# Patient Record
Sex: Male | Born: 1955 | Race: White | Hispanic: No | Marital: Married | Smoking: Never smoker
Health system: Southern US, Community
[De-identification: ages and names within clinical notes are randomized; demographics above are authoritative.]

## PROBLEM LIST (undated history)

## (undated) DIAGNOSIS — M1990 Unspecified osteoarthritis, unspecified site: Secondary | ICD-10-CM

## (undated) DIAGNOSIS — Z8719 Personal history of other diseases of the digestive system: Secondary | ICD-10-CM

## (undated) DIAGNOSIS — J45909 Unspecified asthma, uncomplicated: Secondary | ICD-10-CM

## (undated) DIAGNOSIS — J302 Other seasonal allergic rhinitis: Secondary | ICD-10-CM

## (undated) DIAGNOSIS — K219 Gastro-esophageal reflux disease without esophagitis: Secondary | ICD-10-CM

## (undated) DIAGNOSIS — IMO0002 Reserved for concepts with insufficient information to code with codable children: Secondary | ICD-10-CM

## (undated) HISTORY — PX: KNEE ARTHROSCOPY: SHX127

## (undated) HISTORY — PX: KNEE ARTHROSCOPY: SUR90

## (undated) HISTORY — PX: ESOPHAGEAL DILATION: SHX303

## (undated) HISTORY — PX: CERVICAL SPINE SURGERY: SHX589

---

## 2013-09-27 ENCOUNTER — Other Ambulatory Visit: Payer: Self-pay | Admitting: Neurosurgery

## 2013-10-11 ENCOUNTER — Encounter (HOSPITAL_COMMUNITY): Payer: Self-pay | Admitting: Pharmacy Technician

## 2013-10-13 ENCOUNTER — Encounter (HOSPITAL_COMMUNITY)
Admission: RE | Admit: 2013-10-13 | Discharge: 2013-10-13 | Disposition: A | Discharge status: Home / Self Care | Payer: PRIVATE HEALTH INSURANCE | Source: Ambulatory Visit | Attending: Neurosurgery | Admitting: Neurosurgery

## 2013-10-13 ENCOUNTER — Encounter (HOSPITAL_COMMUNITY): Payer: Self-pay

## 2013-10-13 DIAGNOSIS — Z01812 Encounter for preprocedural laboratory examination: Secondary | ICD-10-CM | POA: Insufficient documentation

## 2013-10-13 HISTORY — DX: Unspecified asthma, uncomplicated: J45.909

## 2013-10-13 HISTORY — DX: Reserved for concepts with insufficient information to code with codable children: IMO0002

## 2013-10-13 HISTORY — DX: Other seasonal allergic rhinitis: J30.2

## 2013-10-13 HISTORY — DX: Gastro-esophageal reflux disease without esophagitis: K21.9

## 2013-10-13 HISTORY — DX: Unspecified osteoarthritis, unspecified site: M19.90

## 2013-10-13 HISTORY — DX: Personal history of other diseases of the digestive system: Z87.19

## 2013-10-13 LAB — CBC
HEMATOCRIT: 46 % (ref 39.0–52.0)
Hemoglobin: 16.3 g/dL (ref 13.0–17.0)
MCH: 30.8 pg (ref 26.0–34.0)
MCHC: 35.4 g/dL (ref 30.0–36.0)
MCV: 87 fL (ref 78.0–100.0)
Platelets: 242 10*3/uL (ref 150–400)
RBC: 5.29 MIL/uL (ref 4.22–5.81)
RDW: 13.3 % (ref 11.5–15.5)
WBC: 7.5 10*3/uL (ref 4.0–10.5)

## 2013-10-13 LAB — BASIC METABOLIC PANEL
BUN: 14 mg/dL (ref 6–23)
CHLORIDE: 102 meq/L (ref 96–112)
CO2: 24 mEq/L (ref 19–32)
Calcium: 9.7 mg/dL (ref 8.4–10.5)
Creatinine, Ser: 0.94 mg/dL (ref 0.50–1.35)
GFR calc Af Amer: >90 mL/min (ref >90)
GLUCOSE: 143 mg/dL — AB (ref 70–99)
POTASSIUM: 4.3 meq/L (ref 3.7–5.3)
Sodium: 141 mEq/L (ref 137–147)

## 2013-10-13 NOTE — Pre-Procedure Instructions (Signed)
Noah PartySteve D Colon  10/13/2013   Your procedure is scheduled on:  Friday, April 10.  [facility], Main Entrance [name]/Entrance "A" at 5:30 AM.  Call this number if you have problems the morning of surgery: [phone]   Remember:   Do not eat food or drink liquids after midnight Thursday, April 9.   Take these medicines the morning of surgery with A SIP OF WATER: baclofen (LIORESAL), cetirizine (ZYRTEC), DULoxetine (CYMBALTA), gabapentin (NEURONTIN).     Do not wear jewelry, make-up or nail polish.  Do not wear lotions, powders, or perfumes.  Men may shave face and neck.  Do not bring valuables to the hospital.  [facility] is not responsible for any belongings or valuables.               Contacts, dentures or bridgework may not be worn into surgery.  Leave suitcase in the car. After surgery it may be brought to your room.  For patients admitted to the hospital, discharge time is determined by your  treatment team.               Patients discharged the day of surgery will not be allowed to drive home.  Name and phone number of your driver: -  Special Instructions: Review  [place] - Preparing For Surgery.   Please read over the following fact sheets that you were given: Pain Booklet, Coughing and Deep Breathing and Surgical Site Infection Prevention

## 2013-10-13 NOTE — Pre-Procedure Instructions (Signed)
Valinda PartySteve D Sibert  10/13/2013   Your procedure is scheduled on:  Friday, April 10.  [facility], Main Entrance [name]/Entrance "A" at 5:30 AM.  Call this number if you have problems the morning of surgery: [phone]   Remember:   Do not eat food or drink liquids after midnight Thursday, April 9.   Take these medicines the morning of surgery with A SIP OF WATER:  baclofen (LIORESAL), cetirizine (ZYRTEC), DULoxetine (CYMBALTA), gabapentin (NEURONTIN).              Stop taking diclofenac (VOLTAREN).   Do not wear jewelry, make-up or nail polish.  Do not wear lotions, powders, or perfumes.   Men may shave face and neck.  Do not bring valuables to the hospital.  [facility] is not responsible  for any belongings or valuables.               Contacts, dentures or bridgework may not be worn into surgery.  Leave suitcase in the car. After surgery it may be brought to your room.  For patients admitted to the hospital, discharge time is determined by your  treatment team.               Patients discharged the day of surgery will not be allowed to drive home.  Name and phone number of your driver: -   Special Instructions: Review  [place] - Preparing For Surgery.   Please read over the following fact sheets that you were given: Pain Booklet, Coughing and Deep Breathing and Surgical Site Infection Prevention

## 2013-10-13 NOTE — Pre-Procedure Instructions (Signed)
Noah PartySteve D Mclaughlin  10/13/2013   Your procedure is scheduled on:  Friday, April 10.  [facility], Main Entrance or Entrance "A" at 5:30AM.  Call this number if you have problems the morning of surgery: [phone]   Remember:   Do not eat food or drink liquids after midnight , April 9.   Take these medicines the morning of surgery with A SIP OF WATER: cetirizine (ZYRTEC), DULoxetine (CYMBALTA), gabapentin (NEURONTIN), baclofen (LIORESAL).              Stop taking diclofenac (VOLTAREN).   Do not wear jewelry, make-up or nail polish.  Do not wear lotions, powders, or perfumes.    Men may shave face and neck.  Do not bring valuables to the hospital.  [facility] is not responsible  for any belongings or valuables.               Contacts, dentures or bridgework may not be worn into surgery.  Leave suitcase in the car. After surgery it may be brought to your room.  For patients admitted to the hospital, discharge time is determined by your treatment team.               Patients discharged the day of surgery will not be allowed to drive home.  Name and phone number of your driver:    Special Instructions: Review  [place] - Preparing For Surgery.   Please read over the following fact sheets that you were given: Pain Booklet, Coughing and Deep Breathing and Surgical Site Infection Prevention

## 2013-10-13 NOTE — Pre-Procedure Instructions (Signed)
Noah PartySteve D Mclaughlin  10/13/2013   Your procedure is scheduled on:  Friday, April 10.  [facility], Main Entrance [name]/Entrance "A" at 5:30 AM.  Call this number if you have problems the morning of surgery: [phone]   Remember:   Do not eat food or drink liquids after midnight Thursday, April 9.      Take these medicines the morning of surgery with A SIP OF WATER: baclofen (LIORESAL),  cetirizine (ZYRTEC), DULoxetine (CYMBALTA), gabapentin (NEURONTIN).            Stop taking:diclofenac (VOLTAREN).              Take if needed:HYDROcodone-acetaminophen (NORCO/VICODIN).    Do not wear jewelry, make-up or nail polish.  Do not wear lotions, powders, or perfumes.   Do not shave 48 hours prior to surgery.  Do not bring valuables to the hospital.  [facility] is not responsible for any belongings or valuables.               Contacts, dentures or bridgework may not be worn into surgery.  Leave suitcase in the car. After surgery it may be brought to your room.  For patients admitted to the hospital, discharge time is determined by your  treatment team.               Patients discharged the day of surgery will not be allowed to drive home.  Name and phone number of your driver: -   Special Instructions: Review  [place] - Preparing For Surgery.   Please read over the following fact sheets that you were given: Pain Booklet, Coughing and Deep Breathing and Surgical Site Infection Prevention

## 2013-10-13 NOTE — Pre-Procedure Instructions (Signed)
Noah Mclaughlin  10/13/2013   Your procedure is scheduled on:  Friday, April 10.  Report to [facility], Main Entrance /Entrance "A" at 5:30 AM.  Call this number if you have problems the morning of surgery: [phone]   Remember:   Do not eat food or drink liquids after midnight Thursday, April 9.   Take these medicines the morning of surgery with A SIP OF WATER: baclofen (LIORESAL), cetirizine (ZYRTEC), DULoxetine (CYMBALTA), gabapentin (NEURONTIN).     Do not wear jewelry, make-up or nail polish.  Do not wear lotions, powders, or perfumes.  Men may shave face and neck.  Do not bring valuables to the hospital.  [place] is not responsible for any belongings or valuables.               Contacts, dentures or bridgework may not be worn into surgery.  Leave suitcase in the car. After surgery it may be brought to your room.  For patients admitted to the hospital, discharge time is determined by your  treatment team.               Patients discharged the day of surgery will not be allowed to drive home.  Name and phone number of your driver: -  Special Instructions: Review  [place] - Preparing For Surgery.   Please read over the following fact sheets that you were given: Pain Booklet, Coughing and Deep Breathing and Surgical Site Infection Prevention   

## 2013-10-13 NOTE — Pre-Procedure Instructions (Signed)
Valinda PartySteve D Albers  10/13/2013   Your procedure is scheduled on: Friday, April 9.   [facility], Main Entrance or Entrance "A" at 5:30AM.  Call this number if you have problems the morning of surgery: [facility], Main Entrance or Entrance "A"   Remember:   Do not eat food or drink liquids after midnight Thursday, April 9.   Take these medicines the morning of surgery with A SIP OF WATER: baclofen (LIORESAL), cetirizine (ZYRTEC), DULoxetine (CYMBALTA), gabapentin (NEURONTIN).               Take if needed:HYDROcodone-acetaminophen (NORCO/VICODIN).               Stop taking diclofenac (VOLTAREN).     Do not wear jewelry, make-up or nail polish.  Do not wear lotions, powders, or perfumes.   Do not shave 48 hours prior to surgery. Men may shave face and neck.  Do not bring valuables to the hospital.  [facility] is not responsiblefor any belongings or valuables.               Contacts, dentures or bridgework may not be worn into surgery.  Leave suitcase in the car. After surgery it may be brought to your room.  For patients admitted to the hospital, discharge time is determined by your treatment team.               Patients discharged the day of surgery will not be allowed to drive home.  Name and phone number of your driver: -   Special Instructions: Review  [place] - Preparing For Surgery.   Please read over the following fact sheets that you were given: Pain Booklet, Coughing and Deep Breathing and Surgical Site Infection Prevention

## 2013-10-20 MED ORDER — CEFAZOLIN SODIUM-DEXTROSE 2-3 GM-% IV SOLR
2.0000 g | INTRAVENOUS | Status: AC
  Administered 2013-10-21: 2 g via INTRAVENOUS
  Filled 2013-10-20: qty 50

## 2013-10-20 MED ORDER — DEXAMETHASONE SODIUM PHOSPHATE 10 MG/ML IJ SOLN
10.0000 mg | INTRAMUSCULAR | Status: DC
  Filled 2013-10-20: qty 1

## 2013-10-21 ENCOUNTER — Encounter (HOSPITAL_COMMUNITY): Payer: PRIVATE HEALTH INSURANCE | Admitting: Anesthesiology

## 2013-10-21 ENCOUNTER — Ambulatory Visit (HOSPITAL_COMMUNITY): Payer: PRIVATE HEALTH INSURANCE | Admitting: Anesthesiology

## 2013-10-21 ENCOUNTER — Ambulatory Visit (HOSPITAL_COMMUNITY): Payer: PRIVATE HEALTH INSURANCE

## 2013-10-21 ENCOUNTER — Ambulatory Visit (HOSPITAL_COMMUNITY)
Admission: RE | Admit: 2013-10-21 | Discharge: 2013-10-22 | Disposition: A | Discharge status: Home / Self Care | Payer: PRIVATE HEALTH INSURANCE | Source: Ambulatory Visit | Attending: Neurosurgery | Admitting: Neurosurgery

## 2013-10-21 ENCOUNTER — Encounter (HOSPITAL_COMMUNITY)
Admission: RE | Disposition: A | Discharge status: Home / Self Care | Payer: Self-pay | Source: Ambulatory Visit | Attending: Neurosurgery

## 2013-10-21 ENCOUNTER — Encounter (HOSPITAL_COMMUNITY): Payer: Self-pay | Admitting: Anesthesiology

## 2013-10-21 DIAGNOSIS — K219 Gastro-esophageal reflux disease without esophagitis: Secondary | ICD-10-CM | POA: Insufficient documentation

## 2013-10-21 DIAGNOSIS — M47812 Spondylosis without myelopathy or radiculopathy, cervical region: Secondary | ICD-10-CM | POA: Insufficient documentation

## 2013-10-21 DIAGNOSIS — K449 Diaphragmatic hernia without obstruction or gangrene: Secondary | ICD-10-CM | POA: Insufficient documentation

## 2013-10-21 DIAGNOSIS — J45909 Unspecified asthma, uncomplicated: Secondary | ICD-10-CM | POA: Insufficient documentation

## 2013-10-21 HISTORY — PX: ANTERIOR CERVICAL DECOMP/DISCECTOMY FUSION: SHX1161

## 2013-10-21 LAB — SURGICAL PCR SCREEN
MRSA, PCR: NEGATIVE
Staphylococcus aureus: NEGATIVE

## 2013-10-21 SURGERY — ANTERIOR CERVICAL DECOMPRESSION/DISCECTOMY FUSION 2 LEVELS
Anesthesia: General | Site: Neck

## 2013-10-21 MED ORDER — ONDANSETRON HCL 4 MG/2ML IJ SOLN: 4.0000 mg | INTRAMUSCULAR | Status: DC | PRN

## 2013-10-21 MED ORDER — HYDROMORPHONE HCL PF 1 MG/ML IJ SOLN
0.2500 mg | INTRAMUSCULAR | Status: DC | PRN
  Administered 2013-10-21 (×4): 0.5 mg via INTRAVENOUS

## 2013-10-21 MED ORDER — STERILE WATER FOR INJECTION IJ SOLN
INTRAMUSCULAR | Status: AC
  Filled 2013-10-21: qty 20

## 2013-10-21 MED ORDER — GABAPENTIN 600 MG PO TABS
600.0000 mg | ORAL_TABLET | Freq: Three times a day (TID) | ORAL | Status: DC
  Administered 2013-10-21 – 2013-10-22 (×3): 600 mg via ORAL
  Filled 2013-10-21 (×5): qty 1

## 2013-10-21 MED ORDER — DEXAMETHASONE SODIUM PHOSPHATE 10 MG/ML IJ SOLN
INTRAMUSCULAR | Status: DC | PRN
  Administered 2013-10-21: 10 mg via INTRAVENOUS

## 2013-10-21 MED ORDER — FENTANYL CITRATE 0.05 MG/ML IJ SOLN
INTRAMUSCULAR | Status: AC
  Filled 2013-10-21: qty 5

## 2013-10-21 MED ORDER — DEXAMETHASONE SODIUM PHOSPHATE 10 MG/ML IJ SOLN
INTRAMUSCULAR | Status: AC
  Filled 2013-10-21: qty 1

## 2013-10-21 MED ORDER — LIDOCAINE HCL (CARDIAC) 20 MG/ML IV SOLN
INTRAVENOUS | Status: DC | PRN
  Administered 2013-10-21: 60 mg via INTRAVENOUS

## 2013-10-21 MED ORDER — HYDROMORPHONE HCL PF 1 MG/ML IJ SOLN
INTRAMUSCULAR | Status: AC
  Administered 2013-10-21: 0.5 mg via INTRAVENOUS
  Filled 2013-10-21: qty 1

## 2013-10-21 MED ORDER — LACTATED RINGERS IV SOLN: INTRAVENOUS | Status: DC | PRN

## 2013-10-21 MED ORDER — ACETAMINOPHEN 325 MG PO TABS: 650.0000 mg | ORAL_TABLET | ORAL | Status: DC | PRN

## 2013-10-21 MED ORDER — MIDAZOLAM HCL 5 MG/5ML IJ SOLN
INTRAMUSCULAR | Status: DC | PRN
  Administered 2013-10-21: 2 mg via INTRAVENOUS

## 2013-10-21 MED ORDER — PHENOL 1.4 % MT LIQD: 1.0000 | OROMUCOSAL | Status: DC | PRN

## 2013-10-21 MED ORDER — ONDANSETRON HCL 4 MG/2ML IJ SOLN
INTRAMUSCULAR | Status: AC
  Filled 2013-10-21: qty 2

## 2013-10-21 MED ORDER — SODIUM CHLORIDE 0.9 % IJ SOLN
3.0000 mL | Freq: Two times a day (BID) | INTRAMUSCULAR | Status: DC
  Administered 2013-10-21 (×2): 3 mL via INTRAVENOUS

## 2013-10-21 MED ORDER — HYDROCODONE-ACETAMINOPHEN 5-325 MG PO TABS
1.0000 | ORAL_TABLET | ORAL | Status: DC | PRN
  Administered 2013-10-21 – 2013-10-22 (×4): 2 via ORAL
  Filled 2013-10-21 (×3): qty 2

## 2013-10-21 MED ORDER — PROPOFOL 10 MG/ML IV BOLUS
INTRAVENOUS | Status: DC | PRN
  Administered 2013-10-21: 200 mg via INTRAVENOUS

## 2013-10-21 MED ORDER — PHENYLEPHRINE 40 MCG/ML (10ML) SYRINGE FOR IV PUSH (FOR BLOOD PRESSURE SUPPORT)
PREFILLED_SYRINGE | INTRAVENOUS | Status: AC
  Filled 2013-10-21: qty 20

## 2013-10-21 MED ORDER — DULOXETINE HCL 60 MG PO CPEP
60.0000 mg | ORAL_CAPSULE | Freq: Every day | ORAL | Status: DC
  Administered 2013-10-21 – 2013-10-22 (×2): 60 mg via ORAL
  Filled 2013-10-21 (×2): qty 1

## 2013-10-21 MED ORDER — ARTIFICIAL TEARS OP OINT
TOPICAL_OINTMENT | OPHTHALMIC | Status: DC | PRN
  Administered 2013-10-21: 1 via OPHTHALMIC

## 2013-10-21 MED ORDER — ACETAMINOPHEN 650 MG RE SUPP: 650.0000 mg | RECTAL | Status: DC | PRN

## 2013-10-21 MED ORDER — VECURONIUM BROMIDE 10 MG IV SOLR
INTRAVENOUS | Status: AC
  Filled 2013-10-21: qty 10

## 2013-10-21 MED ORDER — CEFAZOLIN SODIUM-DEXTROSE 2-3 GM-% IV SOLR
2.0000 g | Freq: Three times a day (TID) | INTRAVENOUS | Status: AC
  Administered 2013-10-21 (×2): 2 g via INTRAVENOUS
  Filled 2013-10-21 (×2): qty 50

## 2013-10-21 MED ORDER — NEOSTIGMINE METHYLSULFATE 1 MG/ML IJ SOLN
INTRAMUSCULAR | Status: AC
  Filled 2013-10-21: qty 10

## 2013-10-21 MED ORDER — PROPOFOL 10 MG/ML IV BOLUS
INTRAVENOUS | Status: AC
  Filled 2013-10-21: qty 20

## 2013-10-21 MED ORDER — LACTATED RINGERS IV SOLN
INTRAVENOUS | Status: DC | PRN
  Administered 2013-10-21 (×2): via INTRAVENOUS

## 2013-10-21 MED ORDER — GLYCOPYRROLATE 0.2 MG/ML IJ SOLN
INTRAMUSCULAR | Status: DC | PRN
  Administered 2013-10-21: 0.6 mg via INTRAVENOUS

## 2013-10-21 MED ORDER — HEMOSTATIC AGENTS (NO CHARGE) OPTIME
TOPICAL | Status: DC | PRN
  Administered 2013-10-21: 1 via TOPICAL

## 2013-10-21 MED ORDER — EPHEDRINE SULFATE 50 MG/ML IJ SOLN
INTRAMUSCULAR | Status: AC
  Filled 2013-10-21: qty 1

## 2013-10-21 MED ORDER — VECURONIUM BROMIDE 10 MG IV SOLR
INTRAVENOUS | Status: DC | PRN
  Administered 2013-10-21: 5 mg via INTRAVENOUS

## 2013-10-21 MED ORDER — HYDROMORPHONE HCL PF 1 MG/ML IJ SOLN: 1.0000 mg | INTRAMUSCULAR | Status: DC | PRN

## 2013-10-21 MED ORDER — DEXTROSE 5 % IV SOLN
INTRAVENOUS | Status: DC | PRN
  Administered 2013-10-21: 08:00:00 via INTRAVENOUS

## 2013-10-21 MED ORDER — MUPIROCIN 2 % EX OINT
TOPICAL_OINTMENT | Freq: Once | CUTANEOUS | Status: DC
  Filled 2013-10-21: qty 22

## 2013-10-21 MED ORDER — MUPIROCIN 2 % EX OINT
TOPICAL_OINTMENT | CUTANEOUS | Status: AC
  Administered 2013-10-21: 1
  Filled 2013-10-21: qty 22

## 2013-10-21 MED ORDER — LIDOCAINE HCL (CARDIAC) 20 MG/ML IV SOLN
INTRAVENOUS | Status: AC
  Filled 2013-10-21: qty 5

## 2013-10-21 MED ORDER — PANTOPRAZOLE SODIUM 40 MG IV SOLR
40.0000 mg | Freq: Every day | INTRAVENOUS | Status: DC
Start: 2013-10-21 — End: 2013-10-21
  Filled 2013-10-21: qty 40

## 2013-10-21 MED ORDER — STERILE WATER FOR INJECTION IJ SOLN
INTRAMUSCULAR | Status: AC
  Filled 2013-10-21: qty 10

## 2013-10-21 MED ORDER — ROCURONIUM BROMIDE 100 MG/10ML IV SOLN
INTRAVENOUS | Status: DC | PRN
  Administered 2013-10-21: 50 mg via INTRAVENOUS

## 2013-10-21 MED ORDER — MIDAZOLAM HCL 2 MG/2ML IJ SOLN
INTRAMUSCULAR | Status: AC
  Filled 2013-10-21: qty 2

## 2013-10-21 MED ORDER — SODIUM CHLORIDE 0.9 % IJ SOLN: 3.0000 mL | INTRAMUSCULAR | Status: DC | PRN

## 2013-10-21 MED ORDER — LIDOCAINE HCL (CARDIAC) 20 MG/ML IV SOLN
INTRAVENOUS | Status: AC
Start: 2013-10-21 — End: 2013-10-21
  Filled 2013-10-21: qty 5

## 2013-10-21 MED ORDER — DEXAMETHASONE 4 MG PO TABS
4.0000 mg | ORAL_TABLET | Freq: Four times a day (QID) | ORAL | Status: AC
  Administered 2013-10-21: 4 mg via ORAL

## 2013-10-21 MED ORDER — PANTOPRAZOLE SODIUM 40 MG PO TBEC
40.0000 mg | DELAYED_RELEASE_TABLET | Freq: Every day | ORAL | Status: DC
  Administered 2013-10-22: 40 mg via ORAL
  Filled 2013-10-21: qty 1

## 2013-10-21 MED ORDER — KCL IN DEXTROSE-NACL 20-5-0.45 MEQ/L-%-% IV SOLN
80.0000 mL/h | INTRAVENOUS | Status: DC
  Filled 2013-10-21 (×3): qty 1000

## 2013-10-21 MED ORDER — LORATADINE 10 MG PO TABS
10.0000 mg | ORAL_TABLET | Freq: Every day | ORAL | Status: DC
  Administered 2013-10-22: 10 mg via ORAL
  Filled 2013-10-21 (×2): qty 1

## 2013-10-21 MED ORDER — SUCCINYLCHOLINE CHLORIDE 20 MG/ML IJ SOLN
INTRAMUSCULAR | Status: AC
  Filled 2013-10-21: qty 2

## 2013-10-21 MED ORDER — BACLOFEN 10 MG PO TABS
10.0000 mg | ORAL_TABLET | Freq: Three times a day (TID) | ORAL | Status: DC
  Administered 2013-10-21 – 2013-10-22 (×3): 10 mg via ORAL
  Filled 2013-10-21 (×5): qty 1

## 2013-10-21 MED ORDER — ROCURONIUM BROMIDE 50 MG/5ML IV SOLN
INTRAVENOUS | Status: AC
  Filled 2013-10-21: qty 1

## 2013-10-21 MED ORDER — FENTANYL CITRATE 0.05 MG/ML IJ SOLN
INTRAMUSCULAR | Status: DC | PRN
  Administered 2013-10-21 (×3): 50 ug via INTRAVENOUS
  Administered 2013-10-21 (×2): 100 ug via INTRAVENOUS
  Administered 2013-10-21 (×2): 50 ug via INTRAVENOUS
  Administered 2013-10-21: 100 ug via INTRAVENOUS

## 2013-10-21 MED ORDER — MENTHOL 3 MG MT LOZG: 1.0000 | LOZENGE | OROMUCOSAL | Status: DC | PRN

## 2013-10-21 MED ORDER — THROMBIN 5000 UNITS EX SOLR
CUTANEOUS | Status: DC | PRN
  Administered 2013-10-21 (×2): 5000 [IU] via TOPICAL

## 2013-10-21 MED ORDER — GABAPENTIN 300 MG PO CAPS
600.0000 mg | ORAL_CAPSULE | Freq: Three times a day (TID) | ORAL | Status: DC
  Filled 2013-10-21 (×2): qty 2

## 2013-10-21 MED ORDER — BACITRACIN 50000 UNITS IM SOLR
INTRAMUSCULAR | Status: DC | PRN
  Administered 2013-10-21: 09:00:00

## 2013-10-21 MED ORDER — DEXAMETHASONE SODIUM PHOSPHATE 4 MG/ML IJ SOLN
4.0000 mg | Freq: Four times a day (QID) | INTRAMUSCULAR | Status: AC
  Administered 2013-10-21: 4 mg via INTRAVENOUS
  Filled 2013-10-21: qty 1

## 2013-10-21 MED ORDER — GLYCOPYRROLATE 0.2 MG/ML IJ SOLN
INTRAMUSCULAR | Status: AC
  Filled 2013-10-21: qty 3

## 2013-10-21 MED ORDER — PHENYLEPHRINE HCL 10 MG/ML IJ SOLN
INTRAMUSCULAR | Status: DC | PRN
  Administered 2013-10-21: 40 ug via INTRAVENOUS

## 2013-10-21 MED ORDER — ONDANSETRON HCL 4 MG/2ML IJ SOLN
INTRAMUSCULAR | Status: AC
  Filled 2013-10-21: qty 4

## 2013-10-21 MED ORDER — NEOSTIGMINE METHYLSULFATE 1 MG/ML IJ SOLN
INTRAMUSCULAR | Status: DC | PRN
  Administered 2013-10-21: 5 mg via INTRAVENOUS

## 2013-10-21 MED ORDER — ONDANSETRON HCL 4 MG/2ML IJ SOLN
INTRAMUSCULAR | Status: DC | PRN
  Administered 2013-10-21 (×2): 4 mg via INTRAVENOUS

## 2013-10-21 MED ORDER — 0.9 % SODIUM CHLORIDE (POUR BTL) OPTIME
TOPICAL | Status: DC | PRN
  Administered 2013-10-21: 1000 mL

## 2013-10-21 MED ORDER — HYDROCODONE-ACETAMINOPHEN 5-325 MG PO TABS
ORAL_TABLET | ORAL | Status: AC
  Filled 2013-10-21: qty 2

## 2013-10-21 MED ORDER — SODIUM CHLORIDE 0.9 % IJ SOLN
INTRAMUSCULAR | Status: AC
  Filled 2013-10-21: qty 10

## 2013-10-21 SURGICAL SUPPLY — 63 items
BAG DECANTER FOR FLEXI CONT (MISCELLANEOUS) ×3 IMPLANT
BENZOIN TINCTURE PRP APPL 2/3 (GAUZE/BANDAGES/DRESSINGS) ×3 IMPLANT
BIT DRILL TRINICA 2.3MM (BIT) ×1 IMPLANT
BRUSH SCRUB EZ PLAIN DRY (MISCELLANEOUS) ×3 IMPLANT
BUR MATCHSTICK NEURO 3.0 LAGG (BURR) ×3 IMPLANT
CANISTER SUCT 3000ML (MISCELLANEOUS) ×3 IMPLANT
CLOSURE WOUND 1/2 X4 (GAUZE/BANDAGES/DRESSINGS) ×1
CONT SPEC 4OZ CLIKSEAL STRL BL (MISCELLANEOUS) ×3 IMPLANT
DRAPE C-ARM 42X72 X-RAY (DRAPES) ×6 IMPLANT
DRAPE LAPAROTOMY 100X72 PEDS (DRAPES) ×3 IMPLANT
DRAPE MICROSCOPE LEICA (MISCELLANEOUS) ×3 IMPLANT
DRAPE MICROSCOPE ZEISS OPMI (DRAPES) IMPLANT
DRAPE SURG 17X23 STRL (DRAPES) ×6 IMPLANT
DRESSING TELFA 8X3 (GAUZE/BANDAGES/DRESSINGS) ×3 IMPLANT
DRILL BIT TRINICA 2.3MM (BIT) ×3
DRSG OPSITE POSTOP 4X6 (GAUZE/BANDAGES/DRESSINGS) ×3 IMPLANT
DURAPREP 6ML APPLICATOR 50/CS (WOUND CARE) ×3 IMPLANT
ELECT COATED BLADE 2.86 ST (ELECTRODE) ×3 IMPLANT
ELECT REM PT RETURN 9FT ADLT (ELECTROSURGICAL) ×3
ELECTRODE REM PT RTRN 9FT ADLT (ELECTROSURGICAL) ×1 IMPLANT
GAUZE SPONGE 4X4 16PLY XRAY LF (GAUZE/BANDAGES/DRESSINGS) IMPLANT
GLOVE BIOGEL PI IND STRL 7.0 (GLOVE) ×1 IMPLANT
GLOVE BIOGEL PI IND STRL 7.5 (GLOVE) ×1 IMPLANT
GLOVE BIOGEL PI INDICATOR 7.0 (GLOVE) ×2
GLOVE BIOGEL PI INDICATOR 7.5 (GLOVE) ×2
GLOVE ECLIPSE 7.0 STRL STRAW (GLOVE) ×6 IMPLANT
GLOVE ECLIPSE 8.0 STRL XLNG CF (GLOVE) ×3 IMPLANT
GLOVE ECLIPSE 8.5 STRL (GLOVE) ×3 IMPLANT
GLOVE EXAM NITRILE LRG STRL (GLOVE) IMPLANT
GLOVE EXAM NITRILE MD LF STRL (GLOVE) ×3 IMPLANT
GLOVE EXAM NITRILE XL STR (GLOVE) IMPLANT
GLOVE EXAM NITRILE XS STR PU (GLOVE) IMPLANT
GLOVE SS BIOGEL STRL SZ 6.5 (GLOVE) ×2 IMPLANT
GLOVE SUPERSENSE BIOGEL SZ 6.5 (GLOVE) ×4
GOWN BRE IMP SLV AUR LG STRL (GOWN DISPOSABLE) IMPLANT
GOWN BRE IMP SLV AUR XL STRL (GOWN DISPOSABLE) IMPLANT
GOWN STRL REIN 2XL LVL4 (GOWN DISPOSABLE) IMPLANT
HALTER HD/CHIN CERV TRACTION D (MISCELLANEOUS) ×3 IMPLANT
INTERBODY TM 11X14X8-7DEG ANG (Metal Cage) ×3 IMPLANT
KIT BASIN OR (CUSTOM PROCEDURE TRAY) ×3 IMPLANT
KIT ROOM TURNOVER OR (KITS) ×3 IMPLANT
NEEDLE SPNL 20GX3.5 QUINCKE YW (NEEDLE) ×3 IMPLANT
NS IRRIG 1000ML POUR BTL (IV SOLUTION) ×3 IMPLANT
PACK LAMINECTOMY NEURO (CUSTOM PROCEDURE TRAY) ×3 IMPLANT
PAD ARMBOARD 7.5X6 YLW CONV (MISCELLANEOUS) ×9 IMPLANT
PATTIES SURGICAL .75X.75 (GAUZE/BANDAGES/DRESSINGS) IMPLANT
PLATE 42MM (Plate) ×3 IMPLANT
PUTTY BONE GRAFT KIT 2.5ML (Bone Implant) ×3 IMPLANT
RUBBERBAND STERILE (MISCELLANEOUS) ×6 IMPLANT
SCREW SELF DRILL FIXED 14MM (Screw) ×9 IMPLANT
SCREW SELF DRILL VAR 14MMX4.2 (Screw) ×9 IMPLANT
SPACER TMS 11X14X6MM (Spacer) ×3 IMPLANT
SPONGE GAUZE 4X4 12PLY (GAUZE/BANDAGES/DRESSINGS) ×3 IMPLANT
SPONGE INTESTINAL PEANUT (DISPOSABLE) ×3 IMPLANT
SPONGE SURGIFOAM ABS GEL SZ50 (HEMOSTASIS) ×3 IMPLANT
STRIP CLOSURE SKIN 1/2X4 (GAUZE/BANDAGES/DRESSINGS) ×2 IMPLANT
SUT PDS AB 5-0 P3 18 (SUTURE) ×3 IMPLANT
SUT VIC AB 3-0 CP2 18 (SUTURE) ×6 IMPLANT
SYR 20ML ECCENTRIC (SYRINGE) ×3 IMPLANT
TOWEL OR 17X24 6PK STRL BLUE (TOWEL DISPOSABLE) ×3 IMPLANT
TOWEL OR 17X26 10 PK STRL BLUE (TOWEL DISPOSABLE) ×3 IMPLANT
TRAP SPECIMEN MUCOUS 40CC (MISCELLANEOUS) ×3 IMPLANT
WATER STERILE IRR 1000ML POUR (IV SOLUTION) ×3 IMPLANT

## 2013-10-21 NOTE — Anesthesia Postprocedure Evaluation (Signed)
  Anesthesia Post-op Note  Patient: Noah Mclaughlin  Procedure(s) Performed: Procedure(s): Cervical five-six, Cervical six-seven Anterior Cervical Decompression/Discectomy Fusion (N/A)  Patient Location: PACU  Anesthesia Type:General  Level of Consciousness: awake  Airway and Oxygen Therapy: Patient Spontanous Breathing  Post-op Pain: mild  Post-op Assessment: Post-op Vital signs reviewed  Post-op Vital Signs: Reviewed  Last Vitals:  Filed Vitals:   10/21/13 1101  BP: 166/98  Pulse: 92  Temp:   Resp: 13    Complications: No apparent anesthesia complications

## 2013-10-21 NOTE — Transfer of Care (Signed)
Immediate Anesthesia Transfer of Care Note  Patient: Noah Mclaughlin  Procedure(s) Performed: Procedure(s): Cervical five-six, Cervical six-seven Anterior Cervical Decompression/Discectomy Fusion (N/A)  Patient Location: PACU  Anesthesia Type:General  Level of Consciousness: sedated, patient cooperative and responds to stimulation  Airway & Oxygen Therapy: Patient Spontanous Breathing and Patient connected to nasal cannula oxygen  Post-op Assessment: Report given to PACU RN, Post -op Vital signs reviewed and stable, Patient moving all extremities and Patient moving all extremities X 4  Post vital signs: Reviewed and stable  Complications: No apparent anesthesia complications

## 2013-10-21 NOTE — Plan of Care (Signed)
Problem: Consults Goal: Diagnosis - Spinal Surgery Outcome: Completed/Met Date Met:  10/21/13 Cervical Spine Fusion

## 2013-10-21 NOTE — Anesthesia Procedure Notes (Signed)
Procedure Name: Intubation Date/Time: 10/21/2013 7:48 AM Performed by: Jacquiline Doe A Pre-anesthesia Checklist: Patient identified, Timeout performed, Emergency Drugs available, Suction available and Patient being monitored Patient Re-evaluated:Patient Re-evaluated prior to inductionOxygen Delivery Method: Circle system utilized Preoxygenation: Pre-oxygenation with 100% oxygen Intubation Type: IV induction and Cricoid Pressure applied Ventilation: Mask ventilation without difficulty and Oral airway inserted - appropriate to patient size Laryngoscope Size: Mac and 4 Grade View: Grade I Tube type: Oral Number of attempts: 1 Airway Equipment and Method: LTA kit utilized and Stylet Placement Confirmation: ETT inserted through vocal cords under direct vision,  breath sounds checked- equal and bilateral and positive ETCO2 Secured at: 23 cm Tube secured with: Tape Dental Injury: Teeth and Oropharynx as per pre-operative assessment

## 2013-10-21 NOTE — Anesthesia Preprocedure Evaluation (Addendum)
Anesthesia Evaluation  Patient identified by MRN, date of birth, ID band Patient awake    Reviewed: Allergy & Precautions, H&P , NPO status   Airway Mallampati: II      Dental   Pulmonary asthma ,  breath sounds clear to auscultation        Cardiovascular negative cardio ROS  Rhythm:Regular Rate:Normal     Neuro/Psych    GI/Hepatic Neg liver ROS, hiatal hernia, GERD-  ,  Endo/Other    Renal/GU negative Renal ROS     Musculoskeletal   Abdominal   Peds  Hematology   Anesthesia Other Findings   Reproductive/Obstetrics                          Anesthesia Physical Anesthesia Plan  ASA: III  Anesthesia Plan: General   Post-op Pain Management:    Induction: Intravenous  Airway Management Planned: Oral ETT  Additional Equipment:   Intra-op Plan:   Post-operative Plan: Extubation in OR  Informed Consent: I have reviewed the patients History and Physical, chart, labs and discussed the procedure including the risks, benefits and alternatives for the proposed anesthesia with the patient or authorized representative who has indicated his/her understanding and acceptance.   Dental advisory given  Plan Discussed with: CRNA and Anesthesiologist  Anesthesia Plan Comments:         Anesthesia Quick Evaluation

## 2013-10-21 NOTE — Op Note (Signed)
Preop diagnosis: Spondylosis C5-6 C6-7 Postop diagnosis: Same Procedure: C5-6 C6-7 decompressive anterior cervical discectomy with trabecular metal interbody fusion and Trinica anterior cervical plating Surgeon: Tremell Reimers Assistant: Pool  After and placed in supine position and 10 pounds halter traction the patient's neck was prepped and draped in the usual sterile fashion. Localizing fluoroscopy was used prior to incision to identify the appropriate level. Transverse incision was made in the right anterior neck supple middle and headed towards the medial aspect of the sternal cremaster muscle. The platysma muscle was then incised transversely and the natural fascial plane between the strap muscles medially and the sternal cremaster laterally was identified and followed down to the anterior aspect the cervical spine. Longus Cole muscles were identified and split in the midline to play bilaterally with unipolar coagulation and Kitner dissection. Subcutaneous tract was placed for exposure and excision approach the appropriate levels. Using a 15 blade the ends the disc at C5-6 and C6-7 was incised. Using pituitary rongeurs and curettes approximately 90% of the disc material was removed. High-speed was used to widen the interspace and bony shavings were saved for use later in the case. At this time the microscope was draped brought in the field and used for the remainder of the case. Starting at C5-6 the remainder of the disc material down the posterior longitudinal ligament was removed. Ligament was then incised transversely and the cut edges removed a Kerrison punch. Thorough decompression was carried out on the spinal dura into the foramen bilaterally until the C6 nerve roots well visualized well decompressed. Similar decompression was then carried out at C6-7. Once again, generous decompression was carried out on the spinal dura to the foramen bilaterally so they were widely patent with excellent decompression  visualization of the nerve roots. At this time inspection was carried out once more both levels for any evidence of residual compression and none could be identified. Irrigation was carried out and any bleeding control proper coagulation Gelfoam. Measurements were taken and one 6 mm a 18 mm lordotic graft was chosen and filled a mixture of autologous bone and morselized allograft. The plugs were and then impacted without difficulty and fossae showed them to be in good position. An appropriately length Trinica anterior cervical plate was then chosen. Under fluoroscopic guidance drill holes were placed followed by placing of 14 mm screws x6. Locking mechanism was rotated locked position and fossae showed good position of the plates screws and plugs. Irrigation was carried out and any bleeding controlled with upper coagulation Gelfoam. The was then closed with inverted Vicryl on the put does muscle in the subcuticular layer. Steri-Strips were placed on the skin. Shortness was then applied all the soft collar and the patient was extubated and taken to recovery in stable condition.

## 2013-10-21 NOTE — H&P (Signed)
Noah Mclaughlin is an 58 y.o. male.   Chief Complaint: Neck pain into the left arm with numbness and tingling HPI: The patient is a 58 year old gentleman who was evaluated in the office for neck pain with radiation into the left arm as well as numbness and tingling of the arm. He's had this problem for a year with no inciting event. He did have posterior neck surgery in 2000 and then go [place] and got good results at that time. With this pain started he waited a few weeks then went to his pain Dr. who here 42 seen for chronic low back issues. He had cervical epidurals physical therapy and has tried hydrocodone baclofen gabapentin Voltaren with mild relief only. [facility] but no surgery had been recommended. He now came for neurosurgical opinion. After evaluation in the office the patient's films were reviewed which showed significant nerve root defects with foraminal nerve root compression at C5-6 and C6-7 on the left symptomatic side. The options were discussed it was elected to proceed with a two-level anterior cervical discectomy with fusion and plating. I had a long discussion with him regarding the risks and benefits of surgical intervention. The risks discussed include but are not limited to bleeding infection weakness numbness paralysis spinal fluid leak coma quadriplegia hoarseness and death. We have discussed alternative methods of therapy offered risks and benefits of nonintervention. He's had the opportunity to ask numerous questions and appears to understand. With this information in hand he has requested we proceed with surgery.  Past Medical History  Diagnosis Date  . Arthritis   . DDD (degenerative disc disease)   . Asthma     has not had a problem in 3 years.   Marland Kitchen. GERD (gastroesophageal reflux disease)     zantac  . H/O hiatal hernia   . Seasonal allergies     Past Surgical History  Procedure Laterality Date  . Cervical  spine surgery    . Knee arthroscopy Left   . Knee arthroscopy Right   . Esophageal dilation      No family history on file. Social History:  reports that he has never smoked. He does not have any smokeless tobacco history on file. He reports that he drinks about 1.8 ounces of alcohol per week. He reports that he does not use illicit drugs.  Allergies: No Known Allergies  Medications Prior to Admission  Medication Sig Dispense Refill  . baclofen (LIORESAL) 10 MG tablet Take 10 mg by mouth 3 (three) times daily.      . cetirizine (ZYRTEC) 10 MG tablet Take 10 mg by mouth daily.      . diclofenac (VOLTAREN) 75 MG EC tablet Take 75 mg by mouth 2 (two) times daily.      . DULoxetine (CYMBALTA) 60 MG capsule Take 60 mg by mouth daily.      Marland Kitchen. gabapentin (NEURONTIN) 300 MG capsule Take 600 mg by mouth 3 (three) times daily.      Marland Kitchen. HYDROcodone-acetaminophen (NORCO/VICODIN) 5-325 MG per tablet Take 1 tablet by mouth every 6 (six) hours as needed for moderate pain.      . ranitidine (ZANTAC) 150 MG tablet Take 150 mg by mouth daily.        No results found for this or any previous visit (from the past 48 hour(s)). No results found.  A comprehensive review of systems was negative.  Blood pressure 134/96, pulse  80, temperature 97.4 F (36.3 C), resp. rate 16, weight 116.121 kg (256 lb), SpO2 100.00%.  The patient is awake alert and oriented. His no facial asymmetry. His gait is nonantalgic. Reflexes are decreased but equal. He is normal strength and sensation. Assessment/Plan Impression is that of spondylosis with nerve root compression at C5-6 and C6-7. The plan is for a two-level anterior cervical discectomy with fusion and plating  Reinaldo Meeker, MD 10/21/2013, 7:31 AM

## 2013-10-22 MED ORDER — HYDROCODONE-ACETAMINOPHEN 5-325 MG PO TABS: 1.0000 | ORAL_TABLET | ORAL | Status: AC | PRN

## 2013-10-22 NOTE — Discharge Summary (Signed)
Physician Discharge Summary  Patient ID: Noah Mclaughlin MRN: [id number] DOB/AGE: 1956-06-21 58 y.o.  Admit date: 10/21/2013 Discharge date: 10/22/2013  Admission Diagnoses:  Discharge Diagnoses:  Active Problems:   Cervical spondylosis   Discharged Condition: good  Hospital Course: Patient admitted to the hospital where he underwent an uncomplicated 2 level anterior cervical decompression and fusion. Postoperatively he is doing well. Preoperative upper extremity pain very much improved. No new weakness or sensory loss. Ready for discharge home.  Consults:   Significant Diagnostic Studies:   Treatments:   Discharge Exam: Blood pressure 134/76, pulse 90, temperature 98.7 F (37.1 C), resp. rate 18, weight 116.121 kg (256 lb), SpO2 98.00%. Awake and alert. Oriented and appropriate. Motor and sensory function intact. Wound clean and dry. Next soft. Chest and abdomen benign.  Disposition: Final discharge disposition not confirmed     Medication List         baclofen 10 MG tablet  Commonly known as:  LIORESAL  Take 10 mg by mouth 3 (three) times daily.     cetirizine 10 MG tablet  Commonly known as:  ZYRTEC  Take 10 mg by mouth daily.     diclofenac 75 MG EC tablet  Commonly known as:  VOLTAREN  Take 75 mg by mouth 2 (two) times daily.     DULoxetine 60 MG capsule  Commonly known as:  CYMBALTA  Take 60 mg by mouth daily.     gabapentin 300 MG capsule  Commonly known as:  NEURONTIN  Take 600 mg by mouth 3 (three) times daily.     HYDROcodone-acetaminophen 5-325 MG per tablet  Commonly known as:  NORCO/VICODIN  Take 1 tablet by mouth every 6 (six) hours as needed for moderate pain.     HYDROcodone-acetaminophen 5-325 MG per tablet  Commonly known as:  NORCO/VICODIN  Take 1-2 tablets by mouth every 4 (four) hours as needed for moderate pain.     ranitidine 150 MG tablet  Commonly known as:  ZANTAC  Take 150 mg by mouth daily.           Follow-up  Information   Follow up with Reinaldo MeekerKRITZER,RANDY O, MD.   Specialty:  Neurosurgery   Contact information:   [address] [place] [address] [phone]       Signed: Temple PaciniHenry A Ataya Murdy 10/22/2013, 10:39 AM

## 2013-10-22 NOTE — Discharge Instructions (Signed)
Wound Care Keep incision covered You may shower from the neck down. You may remove outer bandage after one week.  Do not put any creams, lotions, or ointments on incision. Leave steri-strips on neck.  They will fall off by themselves. Activity At home, rest most of the time. Get up 9 or 10 times each day and take a 15 or 20 minute walk Walk each and every day, increasing distance each day. No lifting greater than 5 lbs.  Avoid excessive neck motions.. No driving or riding in car until further notice at follow up appointment. If provided with neck brace, wear at all times unless instructed otherwise. Diet Resume your normal diet.  Return to Work Will be discussed at you follow up appointment. Call Your Doctor If Any of These Occur Redness, drainage, or swelling at the wound.  Temperature greater than 101 degrees. Severe pain not relieved by pain medication. Incision starts to come apart. Increased difficulty swallowing. Follow Up Appt Call today for appointment in 1-2 weeks ([phone]) or for problems.  If you have any hardware placed in your spine, you will need an x-ray before your appointment.

## 2013-10-22 NOTE — Progress Notes (Signed)
Pt. Alert and oriented, follows simple instructions, denies pain. Incision area without swelling, redness or S/S of infection. Voiding adequate clear yellow urine. Moving all extremities well and vitals stable and documented. Patient discharged home with spouse. Anterior Cervical Fusion surgery notes instructions given to patient and family member for home safety and precautions. Pt. and family stated understanding of instructions given. Pain meds given per Pt.'s request for pain and discomfort of ride home 

## 2013-10-24 ENCOUNTER — Encounter (HOSPITAL_COMMUNITY): Payer: Self-pay | Admitting: Neurosurgery

## 2014-06-30 IMAGING — RF DG C-ARM 61-120 MIN
1 series · 2 of 2 positions shown · non-contrast
Comparison: 08/26/2013

CLINICAL DATA: For C5-C7 anterior cervical spine fusion

EXAM:
DG C-ARM 1-60 MIN; CERVICAL SPINE - 2-3 VIEW
:

[Series 1: run · 2 of 2 slices shown]
[im 1/2]
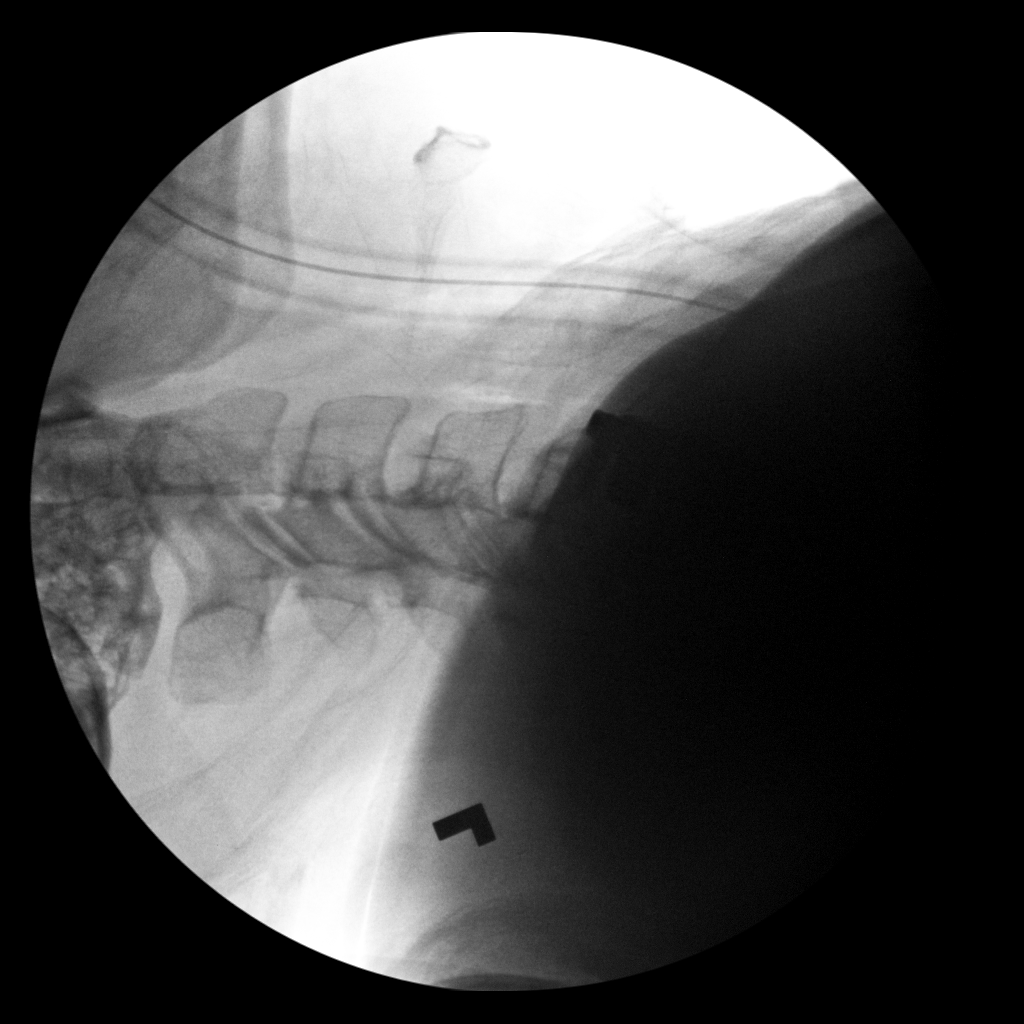
[im 2/2]
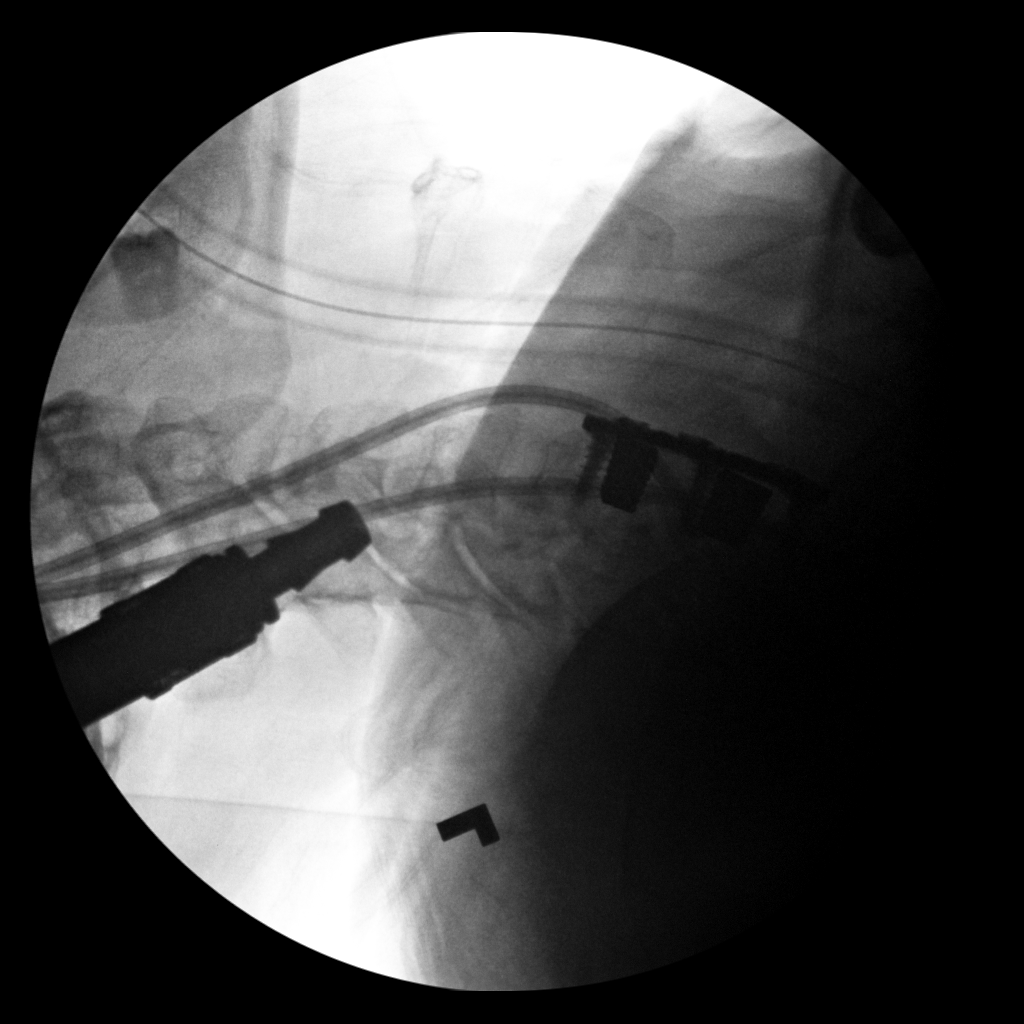

[2 of 2 positions shown; findings below may reference images not displayed]

FINDINGS: Lateral portable cervical spine views show placement of an anterior
fusion plate and screws fusing C5-C7. There are metal intervertebral
cages, which maintained disc space height. The orthopedic hardware
appears well seated. There is no evidence of an operative
complication.
IMPRESSION: Portable images from a C5-C7 anterior cervical spine fusion. Please
refer to the procedure report for complete description.

## 2020-05-21 ENCOUNTER — Encounter (HOSPITAL_BASED_OUTPATIENT_CLINIC_OR_DEPARTMENT_OTHER): Payer: PRIVATE HEALTH INSURANCE | Attending: Internal Medicine | Admitting: Internal Medicine

## 2020-05-21 ENCOUNTER — Other Ambulatory Visit: Payer: Self-pay

## 2020-05-21 DIAGNOSIS — S80812A Abrasion, left lower leg, initial encounter: Secondary | ICD-10-CM | POA: Insufficient documentation

## 2020-05-21 DIAGNOSIS — E11622 Type 2 diabetes mellitus with other skin ulcer: Secondary | ICD-10-CM | POA: Insufficient documentation

## 2020-05-21 DIAGNOSIS — I10 Essential (primary) hypertension: Secondary | ICD-10-CM | POA: Diagnosis not present

## 2020-05-21 DIAGNOSIS — M1990 Unspecified osteoarthritis, unspecified site: Secondary | ICD-10-CM | POA: Insufficient documentation

## 2020-05-21 DIAGNOSIS — E1151 Type 2 diabetes mellitus with diabetic peripheral angiopathy without gangrene: Secondary | ICD-10-CM | POA: Diagnosis not present

## 2020-05-21 DIAGNOSIS — Y93E1 Activity, personal bathing and showering: Secondary | ICD-10-CM | POA: Insufficient documentation

## 2020-05-21 DIAGNOSIS — L97822 Non-pressure chronic ulcer of other part of left lower leg with fat layer exposed: Secondary | ICD-10-CM | POA: Diagnosis present

## 2020-05-21 NOTE — Progress Notes (Signed)
EVERTTE, SONES ([id number]) Visit Report for 05/21/2020 Abuse/Suicide Risk Screen Details Patient Name: Date of Service: BRANDUN, PINN 05/21/2020 10:30 A M Medical Record Number: [id number] Patient Account Number: [id number] Date of Birth/Sex: Treating RN: 1955-07-17 (64 y.o. Judie Petit) Yevonne Pax Primary Care Nikos Anglemyer: SYSTEM, PRO Rodena Goldmann Other Clinician: Referring Samie Barclift: Treating Alexandru Moorer/Extender: Valentino Saxon in Treatment: 0 Abuse/Suicide Risk Screen Items Answer ABUSE RISK SCREEN: Has anyone close to you tried to hurt or harm you recentlyo No Do you feel uncomfortable with anyone in your familyo No Has anyone forced you do things that you didnt want to doo No Electronic Signature(s) Signed: 05/21/2020 5:51:27 PM By: Yevonne Pax RN Entered By: Yevonne Pax on 05/21/2020 11:13:57 -------------------------------------------------------------------------------- Activities of Daily Living Details Patient Name: Date of Service: BRAHEEM, TOMASIK 05/21/2020 10:30 A M Medical Record Number: [id number] Patient Account Number: [id number] Date of Birth/Sex: Treating RN: Sep 15, 1955 (64 y.o. Judie Petit) Yevonne Pax Primary Care Minas Bonser: SYSTEM, PRO Rodena Goldmann Other Clinician: Referring Daya Dutt: Treating Yajaira Doffing/Extender: Valentino Saxon in Treatment: 0 Activities of Daily Living Items Answer Activities of Daily Living (Please select one for each item) Drive Automobile Completely Able T Medications ake Completely Able Use T elephone Completely Able Care for Appearance Completely Able Use T oilet Completely Able Bath / Shower Completely Able Dress Self Completely Able Feed Self Completely Able Walk Completely Able Get In / Out Bed Completely Able Housework Completely Able Prepare Meals Completely Able Handle Money Completely Able Shop for Self Completely Able Electronic Signature(s) Signed: 05/21/2020 5:51:27 PM By: Yevonne Pax RN Entered By: Yevonne Pax on 05/21/2020  11:14:37 -------------------------------------------------------------------------------- Education Screening Details Patient Name: Date of Service: Arbie Cookey D. 05/21/2020 10:30 A M Medical Record Number: [id number] Patient Account Number: [id number] Date of Birth/Sex: Treating RN: [birth year] (64 y.o. Judie Petit) Yevonne Pax Primary Care Eliya Bubar: SYSTEM, PRO Rodena Goldmann Other Clinician: Referring Marcelis Wissner: Treating Irving Bloor/Extender: Valentino Saxon in Treatment: 0 Learning Preferences/Education Level/Primary Language Learning Preference: Explanation Highest Education Level: High School Preferred Language: English Cognitive Barrier Language Barrier: No Translator Needed: No Memory Deficit: No Emotional Barrier: No Cultural/Religious Beliefs Affecting Medical Care: No Physical Barrier Impaired Vision: Yes Glasses Impaired Hearing: No Decreased Hand dexterity: No Knowledge/Comprehension Knowledge Level: Medium Comprehension Level: High Ability to understand written instructions: High Ability to understand verbal instructions: High Motivation Anxiety Level: Anxious Cooperation: Cooperative Education Importance: Acknowledges Need Interest in Health Problems: Asks Questions Perception: Coherent Willingness to Engage in Self-Management High Activities: Readiness to Engage in Self-Management High Activities: Electronic Signature(s) Signed: 05/21/2020 5:51:27 PM By: Yevonne Pax RN Entered By: Yevonne Pax on 05/21/2020 11:15:19 -------------------------------------------------------------------------------- Fall Risk Assessment Details Patient Name: Date of Service: Arbie Cookey D. 05/21/2020 10:30 A M Medical Record Number: [id number] Patient Account Number: [id number] Date of Birth/Sex: Treating RN: February 05, 1956 (64 y.o. Judie Petit) Yevonne Pax Primary Care Taggart Prasad: SYSTEM, PRO V IDER Other Clinician: Referring Silvana Holecek: Treating Lakira Ogando/Extender: Valentino Saxon in  Treatment: 0 Fall Risk Assessment Items Have you had 2 or more falls in the last 12 monthso 0 No Have you had any fall that resulted in injury in the last 12 monthso 0 No FALLS RISK SCREEN History of falling - immediate or within 3 months 25 Yes Secondary diagnosis (Do you have 2 or more medical diagnoseso) 0 No Ambulatory aid None/bed rest/wheelchair/nurse 0 No Crutches/cane/walker 0 No Furniture 0 No Intravenous therapy Access/Saline/Heparin Lock 0 No Gait/Transferring Normal/ bed rest/ wheelchair 0 No Weak (short steps with or without shuffle, stooped but  able to lift head while walking, may seek 0 No support from furniture) Impaired (short steps with shuffle, may have difficulty arising from chair, head down, impaired 0 No balance) Mental Status Oriented to own ability 0 No Electronic Signature(s) Signed: 05/21/2020 5:51:27 PM By: Yevonne Pax RN Entered By: Yevonne Pax on 05/21/2020 11:15:27 -------------------------------------------------------------------------------- Foot Assessment Details Patient Name: Date of Service: Arbie Cookey D. 05/21/2020 10:30 A M Medical Record Number: [id number] Patient Account Number: [id number] Date of Birth/Sex: Treating RN: 01-Jan-1956 (64 y.o. Judie Petit) Yevonne Pax Primary Care Keonta Monceaux: SYSTEM, PRO V IDER Other Clinician: Referring Giordan Fordham: Treating Anniebell Bedore/Extender: Valentino Saxon in Treatment: 0 Foot Assessment Items Site Locations + = Sensation present, - = Sensation absent, C = Callus, U = Ulcer R = Redness, W = Warmth, M = Maceration, PU = Pre-ulcerative lesion F = Fissure, S = Swelling, D = Dryness Assessment Right: Left: Other Deformity: No No Prior Foot Ulcer: No No Prior Amputation: No No Charcot Joint: No No Ambulatory Status: Ambulatory Without Help Gait: Steady Electronic Signature(s) Signed: 05/21/2020 5:51:27 PM By: Yevonne Pax RN Entered By: Yevonne Pax on 05/21/2020  11:18:07 -------------------------------------------------------------------------------- Nutrition Risk Screening Details Patient Name: Date of Service: HILLARD, GOODWINE 05/21/2020 10:30 A M Medical Record Number: [id number] Patient Account Number: [id number] Date of Birth/Sex: Treating RN: 10/07/1955 (64 y.o. Judie Petit) Yevonne Pax Primary Care Edyn Popoca: SYSTEM, PRO V IDER Other Clinician: Referring Xee Hollman: Treating Mariah Harn/Extender: Valentino Saxon in Treatment: 0 Height (in): 71 Weight (lbs): 230 Body Mass Index (BMI): 32.1 Nutrition Risk Screening Items Score Screening NUTRITION RISK SCREEN: I have an illness or condition that made me change the kind and/or amount of food I eat 0 No I eat fewer than two meals per day 0 No I eat few fruits and vegetables, or milk products 0 No I have three or more drinks of beer, liquor or wine almost every day 0 No I have tooth or mouth problems that make it hard for me to eat 0 No I don't always have enough money to buy the food I need 0 No I eat alone most of the time 0 No I take three or more different prescribed or over-the-counter drugs a day 1 Yes Without wanting to, I have lost or gained 10 pounds in the last six months 0 No I am not always physically able to shop, cook and/or feed myself 0 No Nutrition Protocols Good Risk Protocol 0 No interventions needed Moderate Risk Protocol High Risk Proctocol Risk Level: Good Risk Score: 1 Electronic Signature(s) Signed: 05/21/2020 5:51:27 PM By: Yevonne Pax RN Entered By: Yevonne Pax on 05/21/2020 11:15:55

## 2020-05-22 NOTE — Progress Notes (Signed)
EDAN, JUDAY ([id number]) Visit Report for 05/21/2020 Allergy List Details Patient Name: Date of Service: Noah Mclaughlin 05/21/2020 10:30 A M Medical Record Number: [id number] Patient Account Number: [id number] Date of Birth/Sex: Treating RN: [birth year] (64 y.o. Judie Petit) Yevonne Pax Primary Care Hue Steveson: SYSTEM, PRO Rodena Goldmann Other Clinician: Referring Danarius Mcconathy: Treating Avaya Mcjunkins/Extender: Valentino Saxon in Treatment: 0 Allergies Active Allergies No Known Allergies Allergy Notes Electronic Signature(s) Signed: 05/21/2020 5:51:27 PM By: Yevonne Pax RN Entered By: Yevonne Pax on 05/21/2020 11:06:16 -------------------------------------------------------------------------------- Arrival Information Details Patient Name: Date of Service: Noah Cookey D. 05/21/2020 10:30 A M Medical Record Number: [id number] Patient Account Number: [id number] Date of Birth/Sex: Treating RN: 28-Jul-1955 (64 y.o. Judie Petit) Yevonne Pax Primary Care Donnisha Besecker: SYSTEM, PRO V IDER Other Clinician: Referring Jessilynn Taft: Treating Jakaya Jacobowitz/Extender: Valentino Saxon in Treatment: 0 Visit Information Patient Arrived: Ambulatory Arrival Time: 11:05 Accompanied By: wife Transfer Assistance: None Patient Identification Verified: Yes Secondary Verification Process Completed: Yes Patient Requires Transmission-Based Precautions: No Patient Has Alerts: No Electronic Signature(s) Signed: 05/21/2020 5:51:27 PM By: Yevonne Pax RN Entered By: Yevonne Pax on 05/21/2020 11:05:37 -------------------------------------------------------------------------------- Clinic Level of Care Assessment Details Patient Name: Date of Service: Noah Mclaughlin 05/21/2020 10:30 A M Medical Record Number: [id number] Patient Account Number: [id number] Date of Birth/Sex: Treating RN: 05/25/1956 (64 y.o. [name] Primary Care Ariyana Faw: SYSTEM, PRO V IDER Other Clinician: Referring Oneil Behney: Treating Ludwika Rodd/Extender:  Valentino Saxon in Treatment: 0 Clinic Level of Care Assessment Items TOOL 1 Quantity Score X- 1 0 Use when EandM and Procedure is performed on INITIAL visit ASSESSMENTS - Nursing Assessment / Reassessment X- 1 20 General Physical Exam (combine w/ comprehensive assessment (listed just below) when performed on new pt. evals) X- 1 25 Comprehensive Assessment (HX, ROS, Risk Assessments, Wounds Hx, etc.) ASSESSMENTS - Wound and Skin Assessment / Reassessment []  - 0 Dermatologic / Skin Assessment (not related to wound area) ASSESSMENTS - Ostomy and/or Continence Assessment and Care []  - 0 Incontinence Assessment and Management []  - 0 Ostomy Care Assessment and Management (repouching, etc.) PROCESS - Coordination of Care X - Simple Patient / Family Education for ongoing care 1 15 []  - 0 Complex (extensive) Patient / Family Education for ongoing care X- 1 10 Staff obtains , Records, T Results / Process Orders est []  - 0 Staff telephones HHA, Nursing Homes / Clarify orders / etc []  - 0 Routine Transfer to another Facility (non-emergent condition) []  - 0 Routine Hospital Admission (non-emergent condition) X- 1 15 New Admissions / / Ordering NPWT Apligraf, etc. , []  - 0 Emergency Hospital Admission (emergent condition) PROCESS - Special Needs []  - 0 Pediatric / Minor Patient Management []  - 0 Isolation Patient Management []  - 0 Hearing / Language / Visual special needs []  - 0 Assessment of Community assistance (transportation, D/C planning, etc.) []  - 0 Additional assistance / Altered mentation []  - 0 Support Surface(s) Assessment (bed, cushion, seat, etc.) INTERVENTIONS - Miscellaneous []  - 0 External ear exam []  - 0 Patient Transfer (multiple staff / / Similar devices) []  - 0 Simple Staple / Suture removal (25 or less) []  - 0 Complex Staple / Suture removal (26 or more) []  - 0 Hypo/Hyperglycemic Management (do not  check if billed separately) X- 1 15 Ankle / Brachial Index (ABI) - do not check if billed separately Has the patient been seen at the hospital within the last three years: Yes Total Score: 100 Level Of Care: New/Established -  Level 3 Electronic Signature(s) Signed: 05/21/2020 6:16:40 PM By: Zandra Abts RN, BSN Entered By: Zandra Abts on 05/21/2020 13:39:21 -------------------------------------------------------------------------------- Encounter Discharge Information Details Patient Name: Date of Service: Noah Cookey D. 05/21/2020 10:30 A M Medical Record Number: [id number] Patient Account Number: [id number] Date of Birth/Sex: Treating RN: 12-02-1955 (64 y.o. [name] Primary Care Eloina Ergle: SYSTEM, PRO V IDER Other Clinician: Referring Marilee Ditommaso: Treating Ladaysha Soutar/Extender: Valentino Saxon in Treatment: 0 Encounter Discharge Information Items Post Procedure Vitals Discharge Condition: Stable Temperature (F): 98.3 Ambulatory Status: Ambulatory Pulse (bpm): 62 Discharge Destination: Home Respiratory Rate (breaths/min): 18 Transportation: Private Auto Blood Pressure (mmHg): 118/83 Accompanied By: wife Schedule Follow-up Appointment: Yes Clinical Summary of Care: Patient Declined Electronic Signature(s) Signed: 05/21/2020 6:16:40 PM By: Zandra Abts RN, BSN Entered By: Zandra Abts on 05/21/2020 13:41:46 -------------------------------------------------------------------------------- Lower Extremity Assessment Details Patient Name: Date of Service: Noah Cookey D. 05/21/2020 10:30 A M Medical Record Number: [id number] Patient Account Number: [id number] Date of Birth/Sex: Treating RN: [birth year] (64 y.o. Judie Petit) Yevonne Pax Primary Care Trejuan Matherne: SYSTEM, PRO Rodena Goldmann Other Clinician: Referring Vilda Zollner: Treating Amaro Mangold/Extender: Valentino Saxon in Treatment: 0 Edema Assessment Assessed: [Left: No] [Right: No] E[Left: dema] [Right:  :] Calf Left: Right: Point of Measurement: 41 cm From Medial Instep 37 cm Ankle Left: Right: Point of Measurement: 10 cm From Medial Instep 21 cm Vascular Assessment Blood Pressure: Brachial: [Left:118] Ankle: [Left:Dorsalis Pedis: 118 1.00] Electronic Signature(s) Signed: 05/21/2020 5:51:27 PM By: Yevonne Pax RN Entered By: Yevonne Pax on 05/21/2020 11:31:11 -------------------------------------------------------------------------------- Multi Wound Chart Details Patient Name: Date of Service: Noah Cookey D. 05/21/2020 10:30 A M Medical Record Number: [id number] Patient Account Number: [id number] Date of Birth/Sex: Treating RN: [birth year] (64 y.o. [name] Primary Care Arshi Duarte: SYSTEM, PRO V IDER Other Clinician: Referring Maeryn Mcgath: Treating Trevyn Lumpkin/Extender: Valentino Saxon in Treatment: 0 Vital Signs Height(in): 71 Pulse(bpm): 62 Weight(lbs): 230 Blood Pressure(mmHg): 118/83 Body Mass Index(BMI): 32 Temperature(F): 98.3 Respiratory Rate(breaths/min): 18 Photos: [1:No Photos Left Lower Leg] [N/A:N/A N/A] Wound Location: [1:Trauma] [N/A:N/A] Wounding Event: [1:Diabetic Wound/Ulcer of the Lower] [N/A:N/A] Primary Etiology: [1:Extremity Asthma, Type II Diabetes,] [N/A:N/A] Comorbid History: [1:Osteoarthritis 04/16/2020] [N/A:N/A] Date Acquired: [1:0] [N/A:N/A] Weeks of Treatment: [1:Open] [N/A:N/A] Wound Status: [1:3x1x0.1] [N/A:N/A] Measurements L x W x D (cm) [1:2.356] [N/A:N/A] A (cm) : rea [1:0.236] [N/A:N/A] Volume (cm) : [1:Grade 2] [N/A:N/A] Classification: [1:Medium] [N/A:N/A] Exudate A mount: [1:Serosanguineous] [N/A:N/A] Exudate Type: [1:red, brown] [N/A:N/A] Exudate Color: [1:Medium (34-66%)] [N/A:N/A] Granulation A mount: [1:Red, Hyper-granulation] [N/A:N/A] Granulation Quality: [1:Medium (34-66%)] [N/A:N/A] Necrotic A mount: [1:Fat Layer (Subcutaneous Tissue): Yes N/A] Exposed Structures: [1:Fascia: No Tendon: No Muscle: No  Joint: No Bone: No None] [N/A:N/A] Epithelialization: [1:Debridement - Excisional] [N/A:N/A] Debridement: Pre-procedure Verification/Time Out 12:10 [N/A:N/A] Taken: [1:Subcutaneous, Slough] [N/A:N/A] Tissue Debrided: [1:Skin/Subcutaneous Tissue] [N/A:N/A] Level: [1:3] [N/A:N/A] Debridement A (sq cm): [1:rea Curette] [N/A:N/A] Instrument: [1:Minimum] [N/A:N/A] Bleeding: [1:Pressure] [N/A:N/A] Hemostasis A chieved: [1:3] [N/A:N/A] Procedural Pain: [1:0] [N/A:N/A] Post Procedural Pain: [1:Procedure was tolerated well] [N/A:N/A] Debridement Treatment Response: [1:3x1x0.1] [N/A:N/A] Post Debridement Measurements L x W x D (cm) [1:0.236] [N/A:N/A] Post Debridement Volume: (cm) [1:Debridement] [N/A:N/A] Treatment Notes Electronic Signature(s) Signed: 05/21/2020 6:16:40 PM By: Zandra Abts RN, BSN Signed: 05/22/2020 3:11:24 PM By: Baltazar Najjar MD Entered By: Baltazar Najjar on 05/21/2020 12:30:51 -------------------------------------------------------------------------------- Multi-Disciplinary Care Plan Details Patient Name: Date of Service: Noah Cookey D. 05/21/2020 10:30 A M Medical Record Number: [id number] Patient Account Number: [id number] Date of Birth/Sex: Treating RN: May 16, 1956 (64 y.o. [name] Primary Care  Maridel Pixler: SYSTEM, PRO V IDER Other Clinician: Referring Briceyda Abdullah: Treating Asianna Brundage/Extender: Valentino Saxon in Treatment: 0 Active Inactive Nutrition Nursing Diagnoses: Impaired glucose control: actual or potential Potential for alteratiion in Nutrition/Potential for imbalanced nutrition Goals: Patient/caregiver agrees to and verbalizes understanding of need to use nutritional supplements and/or vitamins as prescribed Date Initiated: 05/21/2020 Target Resolution Date: 06/22/2020 Goal Status: Active Patient/caregiver will maintain therapeutic glucose control Date Initiated: 05/21/2020 Target Resolution Date: 06/22/2020 Goal Status:  Active Interventions: Assess HgA1c results as ordered upon admission and as needed Assess patient nutrition upon admission and as needed per policy Provide education on elevated blood sugars and impact on wound healing Provide education on nutrition Notes: Wound/Skin Impairment Nursing Diagnoses: Impaired tissue integrity Knowledge deficit related to ulceration/compromised skin integrity Goals: Patient/caregiver will verbalize understanding of skin care regimen Date Initiated: 05/21/2020 Target Resolution Date: 06/22/2020 Goal Status: Active Ulcer/skin breakdown will have a volume reduction of 30% by week 4 Date Initiated: 05/21/2020 Target Resolution Date: 06/22/2020 Goal Status: Active Interventions: Assess patient/caregiver ability to obtain necessary supplies Assess patient/caregiver ability to perform ulcer/skin care regimen upon admission and as needed Assess ulceration(s) every visit Provide education on ulcer and skin care Notes: Electronic Signature(s) Signed: 05/21/2020 6:16:40 PM By: Zandra Abts RN, BSN Entered By: Zandra Abts on 05/21/2020 12:10:17 -------------------------------------------------------------------------------- Pain Assessment Details Patient Name: Date of Service: Noah Cookey D. 05/21/2020 10:30 A M Medical Record Number: [id number] Patient Account Number: [id number] Date of Birth/Sex: Treating RN: September 25, 1955 (64 y.o. Judie Petit) Yevonne Pax Primary Care Slayde Brault: SYSTEM, PRO Rodena Goldmann Other Clinician: Referring Muriel Hannold: Treating Inell Mimbs/Extender: Valentino Saxon in Treatment: 0 Active Problems Location of Pain Severity and Description of Pain Patient Has Paino No Site Locations Pain Management and Medication Current Pain Management: Electronic Signature(s) Signed: 05/21/2020 5:51:27 PM By: Yevonne Pax RN Entered By: Yevonne Pax on 05/21/2020  11:35:05 -------------------------------------------------------------------------------- Patient/Caregiver Education Details Patient Name: Date of Service: [place] 11/8/2021andnbsp10:30 A M Medical Record Number: [id number] Patient Account Number: [id number] Date of Birth/Gender: Treating RN: 1956-06-26 (64 y.o. [name] Primary Care Physician: SYSTEM, PRO V IDER Other Clinician: Referring Physician: Treating Physician/Extender: Valentino Saxon in Treatment: 0 Education Assessment Education Provided To: Patient Education Topics Provided Elevated Blood Sugar/ Impact on Healing: Methods: Explain/Verbal Responses: State content correctly Nutrition: Methods: Explain/Verbal Responses: State content correctly Wound/Skin Impairment: Methods: Explain/Verbal Responses: State content correctly Electronic Signature(s) Signed: 05/21/2020 6:16:40 PM By: Zandra Abts RN, BSN Entered By: Zandra Abts on 05/21/2020 13:38:35 -------------------------------------------------------------------------------- Wound Assessment Details Patient Name: Date of Service: Noah Cookey D. 05/21/2020 10:30 A M Medical Record Number: [id number] Patient Account Number: [id number] Date of Birth/Sex: Treating RN: [birth year] (64 y.o. Judie Petit) Yevonne Pax Primary Care Lynden Carrithers: SYSTEM, PRO Rodena Goldmann Other Clinician: Referring Kammie Scioli: Treating Kadasia Kassing/Extender: Valentino Saxon in Treatment: 0 Wound Status Wound Number: 1 Primary Etiology: Diabetic Wound/Ulcer of the Lower Extremity Wound Location: Left Lower Leg Wound Status: Open Wounding Event: Trauma Comorbid History: Asthma, Type II Diabetes, Osteoarthritis Date Acquired: 04/16/2020 Weeks Of Treatment: 0 Clustered Wound: No Wound Measurements Length: (cm) 3 Width: (cm) 1 Depth: (cm) 0.1 Area: (cm) 2.356 Volume: (cm) 0.236 % Reduction in Area: % Reduction in Volume: Epithelialization: None Tunneling:  No Undermining: No Wound Description Classification: Grade 2 Exudate Amount: Medium Exudate Type: Serosanguineous Exudate Color: red, brown Foul Odor After Cleansing: No Slough/Fibrino Yes Wound Bed Granulation Amount: Medium (34-66%) Exposed Structure Granulation Quality: Red, Hyper-granulation Fascia Exposed: No Necrotic Amount: Medium (34-66%) Fat Layer (Subcutaneous Tissue)  Exposed: Yes Necrotic Quality: Adherent Slough Tendon Exposed: No Muscle Exposed: No Joint Exposed: No Bone Exposed: No Electronic Signature(s) Signed: 05/21/2020 5:51:27 PM By: Yevonne PaxEpps, Carrie RN Entered By: Yevonne PaxEpps, Carrie on 05/21/2020 11:33:31 -------------------------------------------------------------------------------- Vitals Details Patient Name: Date of Service: Noah CookeyMURPHY, STEV E D. 05/21/2020 10:30 A M Medical Record Number: [id number] Patient Account Number: [id number] Date of Birth/Sex: Treating RN: 1956-02-07 (64 y.o. Judie PetitM) Yevonne PaxEpps, Carrie Primary Care Etienne Mowers: SYSTEM, PRO V IDER Other Clinician: Referring Kaynen Minner: Treating Glenn Gullickson/Extender: Valentino Saxonobson, Michael Weeks in Treatment: 0 Vital Signs Time Taken: 11:05 Temperature (F): 98.3 Height (in): 71 Pulse (bpm): 62 Source: Stated Respiratory Rate (breaths/min): 18 Weight (lbs): 230 Blood Pressure (mmHg): 118/83 Source: Stated Reference Range: 80 - 120 mg / dl Body Mass Index (BMI): 32.1 Electronic Signature(s) Signed: 05/21/2020 5:51:27 PM By: Yevonne PaxEpps, Carrie RN Entered By: Yevonne PaxEpps, Carrie on 05/21/2020 11:06:08

## 2020-05-22 NOTE — Progress Notes (Signed)
Noah Mclaughlin, Noah D. ([id number]) Visit Report for 05/21/2020 Chief Complaint Document Details Patient Name: Date of Service: Noah BellsMURPHY, [name] 05/21/2020 10:30 A M Medical Record Number: [id number] Patient Account Number: [id number] Date of Birth/Sex: Treating RN: [birth year] (64 y.o. [name]) Noah Mclaughlin Primary Care Provider: SYSTEM, PRO V IDER Other Clinician: Referring Provider: Treating Provider/Extender: Valentino Saxonobson, Alethea Terhaar Weeks in Treatment: 0 Information Obtained from: Patient Chief Complaint 05/21/2020; patient is here for review of a traumatic wound on the left anterior upper tibia Electronic Signature(s) Signed: 05/22/2020 3:11:24 PM By: Baltazar Najjarobson, Blakely Gluth MD Entered By: Baltazar Najjarobson, Dannielle Baskins on 05/21/2020 12:32:23 -------------------------------------------------------------------------------- Debridement Details Patient Name: Date of Service: Noah Mclaughlin, [name] 05/21/2020 10:30 A M Medical Record Number: [id number] Patient Account Number: [id number] Date of Birth/Sex: Treating RN: [birth year](64 y.o. [name]) Noah Mclaughlin Primary Care Provider: SYSTEM, PRO V IDER Other Clinician: Referring Provider: Treating Provider/Extender: Valentino Saxonobson, Dontell Mian Weeks in Treatment: 0 Debridement Performed for Assessment: Wound #1 Left,Anterior Lower Leg Performed By: Physician Maxwell Caulobson, Hagan Vanauken G., MD Debridement Type: Debridement Severity of Tissue Pre Debridement: Fat layer exposed Level of Consciousness (Pre-procedure): Awake and Alert Pre-procedure Verification/Time Out Yes - 12:10 Taken: Start Time: 12:10 T Area Debrided (L x W): otal 3 (cm) x 1 (cm) = 3 (cm) Tissue and other material debrided: Viable, Non-Viable, Slough, Subcutaneous, Slough Level: Skin/Subcutaneous Tissue Debridement Description: Excisional Instrument: Curette Bleeding: Minimum Hemostasis Achieved: Pressure End Time: 12:11 Procedural Pain: 3 Post Procedural Pain: 0 Response to Treatment: Procedure was tolerated well Level of  Consciousness (Post- Awake and Alert procedure): Post Debridement Measurements of Total Wound Length: (cm) 3 Width: (cm) 1 Depth: (cm) 0.1 Volume: (cm) 0.236 Character of Wound/Ulcer Post Debridement: Improved Severity of Tissue Post Debridement: Fat layer exposed Post Procedure Diagnosis Same as Pre-procedure Electronic Signature(s) Signed: 05/21/2020 6:16:40 PM By: Zandra AbtsLynch, Shatara RN, BSN Signed: 05/22/2020 3:11:24 PM By: Baltazar Najjarobson, Doll Frazee MD Entered By: Baltazar Najjarobson, Ashleyanne Hemmingway on 05/21/2020 12:32:07 -------------------------------------------------------------------------------- HPI Details Patient Name: Date of Service: Noah Mclaughlin, [name] 05/21/2020 10:30 A M Medical Record Number: [id number] Patient Account Number: [id number] Date of Birth/Sex: Treating RN: [birth year](64 y.o. [name]) Noah Mclaughlin Primary Care Provider: SYSTEM, PRO V IDER Other Clinician: Referring Provider: Treating Provider/Extender: Valentino Saxonobson, Alyse Kathan Weeks in Treatment: 0 History of Present Illness HPI Description: ADMISSION 05/21/2020 This is a 64 year old man who is self-referred. He lives in the [place] area. October 4 he fell while getting out of the shower managed to traumatize his left anterior upper leg. He apparently went to an urgent care on October 10 and x-ray of the area was negative it sounds as though he got Rocephin and then an additional antibiotic for 10 days that were not sure of. He is a type II diabetic but does not have much else in terms of past medical history. Not have a wound history. He arrives in clinic today with the wound significantly hyper granulated. They have been using Neosporin and a dry dressing. Past medical history includes C2 fusion, C5-C6 decompression and fusion, asthma, type 2 diabetes and hypertension ABI in our clinic was 1.0 Electronic Signature(s) Signed: 05/22/2020 3:11:24 PM By: Baltazar Najjarobson, Kekoa Fyock MD Entered By: Baltazar Najjarobson, Yaritzi Craun on 05/21/2020  12:34:06 -------------------------------------------------------------------------------- Physical Exam Details Patient Name: Date of Service: Noah Mclaughlin, [name] 05/21/2020 10:30 A M Medical Record Number: [id number] Patient Account Number: [id number] Date of Birth/Sex: Treating RN: [birth year](64 y.o. [name]) Noah Mclaughlin Primary Care Provider: SYSTEM, PRO V IDER Other Clinician: Referring Provider: Treating Provider/Extender: Valentino Saxonobson, Frankie Scipio Weeks in Treatment: 0 Constitutional Sitting or  standing Blood Pressure is within target range for patient.. Pulse regular and within target range for patient.Marland Kitchen Respirations regular, non-labored and within target range.. Temperature is normal and within the target range for the patient.Marland Kitchen Appears in no distress. Cardiovascular Left popliteal pulses palpable. Pedal pulses palpable in the left foot.Marland Kitchen Psychiatric appears at normal baseline. Notes Wound exam The areas on the left anterior mid tibia area. Most of the surface here very significantly hyper granulated I am not sure exactly why this would have happened. Nevertheless there is no chance of getting skin over the top of this. I used a #5 curette to knock this back to subcutaneous level. Hemostasis with a pressure dressing. There is no evidence of surrounding infection Electronic Signature(s) Signed: 05/22/2020 3:11:24 PM By: Baltazar Najjar MD Entered By: Baltazar Najjar on 05/21/2020 12:37:04 -------------------------------------------------------------------------------- Physician Orders Details Patient Name: Date of Service: Noah Cookey D. 05/21/2020 10:30 A M Medical Record Number: [id number] Patient Account Number: [id number] Date of Birth/Sex: Treating RN: 04-28-1956 (64 y.o. [name] Primary Care Provider: SYSTEM, PRO V IDER Other Clinician: Referring Provider: Treating Provider/Extender: Valentino Saxon in Treatment: 0 Verbal / Phone Orders: No Diagnosis  Coding Follow-up Appointments Return Appointment in 1 week. Dressing Change Frequency Wound #1 Left,Anterior Lower Leg Change Dressing every other day. Wound Cleansing May shower and wash wound with soap and water. - on days that dressing is changed Primary Wound Dressing Wound #1 Left,Anterior Lower Leg Hydrofera Blue - Ready Secondary Dressing Wound #1 Left,Anterior Lower Leg Foam Border - or large bandaid Edema Control Avoid standing for long periods of time Elevate legs to the level of the heart or above for 30 minutes daily and/or when sitting, a frequency of: - throughout the day Electronic Signature(s) Signed: 05/21/2020 6:16:40 PM By: Zandra Abts RN, BSN Signed: 05/22/2020 3:11:24 PM By: Baltazar Najjar MD Entered By: Zandra Abts on 05/21/2020 12:16:36 -------------------------------------------------------------------------------- Problem List Details Patient Name: Date of Service: Noah Cookey D. 05/21/2020 10:30 A M Medical Record Number: [id number] Patient Account Number: [id number] Date of Birth/Sex: Treating RN: 1956/06/19 (64 y.o. [name] Primary Care Provider: SYSTEM, PRO V IDER Other Clinician: Referring Provider: Treating Provider/Extender: Valentino Saxon in Treatment: 0 Active Problems ICD-10 Encounter Code Description Active Date MDM Diagnosis S80.812D Abrasion, left lower leg, subsequent encounter 05/21/2020 No Yes L97.822 Non-pressure chronic ulcer of other part of left lower leg with fat layer exposed11/02/2020 No Yes E11.622 Type 2 diabetes mellitus with other skin ulcer 05/21/2020 No Yes Inactive Problems Resolved Problems Electronic Signature(s) Signed: 05/22/2020 3:11:24 PM By: Baltazar Najjar MD Entered By: Baltazar Najjar on 05/21/2020 12:30:28 -------------------------------------------------------------------------------- Progress Note Details Patient Name: Date of Service: Noah Cookey D. 05/21/2020 10:30 A  M Medical Record Number: [id number] Patient Account Number: [id number] Date of Birth/Sex: Treating RN: [birth year] (64 y.o. [name] Primary Care Provider: SYSTEM, PRO V IDER Other Clinician: Referring Provider: Treating Provider/Extender: Valentino Saxon in Treatment: 0 Subjective Chief Complaint Information obtained from Patient 05/21/2020; patient is here for review of a traumatic wound on the left anterior upper tibia History of Present Illness (HPI) ADMISSION 05/21/2020 This is a 64 year old man who is self-referred. He lives in the [place] area. October 4 he fell while getting out of the shower managed to traumatize his left anterior upper leg. He apparently went to an urgent care on October 10 and x-ray of the area was negative it sounds as though he got Rocephin and then an additional antibiotic for 10  days that were not sure of. He is a type II diabetic but does not have much else in terms of past medical history. Not have a wound history. He arrives in clinic today with the wound significantly hyper granulated. They have been using Neosporin and a dry dressing. Past medical history includes C2 fusion, C5-C6 decompression and fusion, asthma, type 2 diabetes and hypertension ABI in our clinic was 1.0 Patient History Information obtained from Patient. Allergies No Known Allergies Family History Cancer - Mother, Diabetes - Mother, Hypertension - Mother, Thyroid Problems - Mother, No family history of Heart Disease, Hereditary Spherocytosis, Kidney Disease, Lung Disease, Seizures, Stroke, Tuberculosis. Social History Never smoker, Marital Status - Married, Alcohol Use - Moderate, Drug Use - No History, Caffeine Use - Daily. Medical History Eyes Denies history of Cataracts, Glaucoma, Optic Neuritis Ear/Nose/Mouth/Throat Denies history of Chronic sinus problems/congestion, Middle ear problems Hematologic/Lymphatic Denies history of Anemia, Hemophilia, Human  Immunodeficiency Virus, Lymphedema, Sickle Cell Disease Respiratory Patient has history of Asthma Denies history of Aspiration, Chronic Obstructive Pulmonary Disease (COPD), Pneumothorax, Sleep Apnea, Tuberculosis Cardiovascular Denies history of Angina, Arrhythmia, Congestive Heart Failure, Coronary Artery Disease, Deep Vein Thrombosis, Hypertension, Hypotension, Myocardial Infarction, Peripheral Arterial Disease, Peripheral Venous Disease, Phlebitis, Vasculitis Gastrointestinal Denies history of Cirrhosis , Colitis, Crohnoos, Hepatitis A, Hepatitis B, Hepatitis C Endocrine Patient has history of Type II Diabetes Denies history of Type I Diabetes Genitourinary Denies history of End Stage Renal Disease Immunological Denies history of Lupus Erythematosus, Raynaudoos, Scleroderma Integumentary (Skin) Denies history of History of Burn Musculoskeletal Patient has history of Osteoarthritis Denies history of Gout, Rheumatoid Arthritis, Osteomyelitis Neurologic Denies history of Dementia, Neuropathy, Quadriplegia, Paraplegia, Seizure Disorder Oncologic Denies history of Received Chemotherapy, Received Radiation Psychiatric Denies history of Anorexia/bulimia, Confinement Anxiety Patient is treated with Oral Agents. Blood sugar is tested. Blood sugar results noted at the following times: Breakfast - 100, Bedtime - 110. Review of Systems (ROS) Constitutional Symptoms (General Health) Denies complaints or symptoms of Fatigue, Fever, Chills, Marked Weight Change. Eyes Complains or has symptoms of Glasses / Contacts. Denies complaints or symptoms of Dry Eyes, Vision Changes. Ear/Nose/Mouth/Throat Denies complaints or symptoms of Chronic sinus problems or rhinitis. Respiratory Denies complaints or symptoms of Chronic or frequent coughs, Shortness of Breath. Cardiovascular Denies complaints or symptoms of Chest pain. Gastrointestinal Denies complaints or symptoms of Frequent diarrhea,  Nausea, Vomiting. Endocrine Denies complaints or symptoms of Heat/cold intolerance. Genitourinary Denies complaints or symptoms of Frequent urination. Integumentary (Skin) Complains or has symptoms of Wounds. Musculoskeletal Denies complaints or symptoms of Muscle Pain, Muscle Weakness. Neurologic Denies complaints or symptoms of Numbness/parasthesias. Psychiatric Denies complaints or symptoms of Claustrophobia, Suicidal. Objective Constitutional Sitting or standing Blood Pressure is within target range for patient.. Pulse regular and within target range for patient.Marland Kitchen Respirations regular, non-labored and within target range.. Temperature is normal and within the target range for the patient.Marland Kitchen Appears in no distress. Vitals Time Taken: 11:05 AM, Height: 71 in, Source: Stated, Weight: 230 lbs, Source: Stated, BMI: 32.1, Temperature: 98.3 F, Pulse: 62 bpm, Respiratory Rate: 18 breaths/min, Blood Pressure: 118/83 mmHg. Cardiovascular Left popliteal pulses palpable. Pedal pulses palpable in the left foot.Marland Kitchen Psychiatric appears at normal baseline. General Notes: Wound exam ooThe areas on the left anterior mid tibia area. Most of the surface here very significantly hyper granulated I am not sure exactly why this would have happened. Nevertheless there is no chance of getting skin over the top of this. I used a #5 curette to knock this back  to subcutaneous level. Hemostasis with a pressure dressing. There is no evidence of surrounding infection Integumentary (Hair, Skin) Wound #1 status is Open. Original cause of wound was Trauma. The wound is located on the Left,Anterior Lower Leg. The wound measures 3cm length x 1cm width x 0.1cm depth; 2.356cm^2 area and 0.236cm^3 volume. There is Fat Layer (Subcutaneous Tissue) exposed. There is no tunneling or undermining noted. There is a medium amount of serosanguineous drainage noted. There is medium (34-66%) red, hyper - granulation within the wound  bed. There is a medium (34- 66%) amount of necrotic tissue within the wound bed including Adherent Slough. Assessment Active Problems ICD-10 Abrasion, left lower leg, subsequent encounter Non-pressure chronic ulcer of other part of left lower leg with fat layer exposed Type 2 diabetes mellitus with other skin ulcer Procedures Wound #1 Pre-procedure diagnosis of Wound #1 is a Diabetic Wound/Ulcer of the Lower Extremity located on the Left,Anterior Lower Leg .Severity of Tissue Pre Debridement is: Fat layer exposed. There was a Excisional Skin/Subcutaneous Tissue Debridement with a total area of 3 sq cm performed by Maxwell Caul., MD. With the following instrument(s): Curette to remove Viable and Non-Viable tissue/material. Material removed includes Subcutaneous Tissue and Slough and. No specimens were taken. A time out was conducted at 12:10, prior to the start of the procedure. A Minimum amount of bleeding was controlled with Pressure. The procedure was tolerated well with a pain level of 3 throughout and a pain level of 0 following the procedure. Post Debridement Measurements: 3cm length x 1cm width x 0.1cm depth; 0.236cm^3 volume. Character of Wound/Ulcer Post Debridement is improved. Severity of Tissue Post Debridement is: Fat layer exposed. Post procedure Diagnosis Wound #1: Same as Pre-Procedure Plan Follow-up Appointments: Return Appointment in 1 week. Dressing Change Frequency: Wound #1 Left,Anterior Lower Leg: Change Dressing every other day. Wound Cleansing: May shower and wash wound with soap and water. - on days that dressing is changed Primary Wound Dressing: Wound #1 Left,Anterior Lower Leg: Hydrofera Blue - Ready Secondary Dressing: Wound #1 Left,Anterior Lower Leg: Foam Border - or large bandaid Edema Control: Avoid standing for long periods of time Elevate legs to the level of the heart or above for 30 minutes daily and/or when sitting, a frequency of: -  throughout the day 1. Debridement with a #5 curette hemostasis with direct pressure. I am going to use Hydrofera Blue under border foam. 2. No need to put this under compression 3. He was given Rocephin and probably doxycycline I think at an urgent care center in [place] a little less than a month ago. I do not see any evidence of infection currently. No cultures were done and no additional antibiotics were felt to be necessary 4. The patient is retired. I asked him to avoid putting any pressure on this area which should not be too difficult. I spent 35 minutes in review of this patient's past medical history, face-to-face evaluation and preparation of this record Electronic Signature(s) Signed: 05/22/2020 3:11:24 PM By: Baltazar Najjar MD Entered By: Baltazar Najjar on 05/21/2020 12:39:02 -------------------------------------------------------------------------------- HxROS Details Patient Name: Date of Service: Noah Cookey D. 05/21/2020 10:30 A M Medical Record Number: [id number] Patient Account Number: [id number] Date of Birth/Sex: Treating RN: 04/05/1956 (64 y.o. Judie Petit) Yevonne Pax Primary Care Provider: SYSTEM, PRO Rodena Goldmann Other Clinician: Referring Provider: Treating Provider/Extender: Valentino Saxon in Treatment: 0 Information Obtained From Patient Constitutional Symptoms (General Health) Complaints and Symptoms: Negative for: Fatigue; Fever; Chills; Marked Weight Change Eyes Complaints  and Symptoms: Positive for: Glasses / Contacts Negative for: Dry Eyes; Vision Changes Medical History: Negative for: Cataracts; Glaucoma; Optic Neuritis Ear/Nose/Mouth/Throat Complaints and Symptoms: Negative for: Chronic sinus problems or rhinitis Medical History: Negative for: Chronic sinus problems/congestion; Middle ear problems Respiratory Complaints and Symptoms: Negative for: Chronic or frequent coughs; Shortness of Breath Medical History: Positive for: Asthma Negative  for: Aspiration; Chronic Obstructive Pulmonary Disease (COPD); Pneumothorax; Sleep Apnea; Tuberculosis Cardiovascular Complaints and Symptoms: Negative for: Chest pain Medical History: Negative for: Angina; Arrhythmia; Congestive Heart Failure; Coronary Artery Disease; Deep Vein Thrombosis; Hypertension; Hypotension; Myocardial Infarction; Peripheral Arterial Disease; Peripheral Venous Disease; Phlebitis; Vasculitis Gastrointestinal Complaints and Symptoms: Negative for: Frequent diarrhea; Nausea; Vomiting Medical History: Negative for: Cirrhosis ; Colitis; Crohns; Hepatitis A; Hepatitis B; Hepatitis C Endocrine Complaints and Symptoms: Negative for: Heat/cold intolerance Medical History: Positive for: Type II Diabetes Negative for: Type I Diabetes Time with diabetes: 4 years Treated with: Oral agents Blood sugar tested every day: Yes Tested : BID Blood sugar testing results: Breakfast: 100; Bedtime: 110 Genitourinary Complaints and Symptoms: Negative for: Frequent urination Medical History: Negative for: End Stage Renal Disease Integumentary (Skin) Complaints and Symptoms: Positive for: Wounds Medical History: Negative for: History of Burn Musculoskeletal Complaints and Symptoms: Negative for: Muscle Pain; Muscle Weakness Medical History: Positive for: Osteoarthritis Negative for: Gout; Rheumatoid Arthritis; Osteomyelitis Neurologic Complaints and Symptoms: Negative for: Numbness/parasthesias Medical History: Negative for: Dementia; Neuropathy; Quadriplegia; Paraplegia; Seizure Disorder Psychiatric Complaints and Symptoms: Negative for: Claustrophobia; Suicidal Medical History: Negative for: Anorexia/bulimia; Confinement Anxiety Hematologic/Lymphatic Medical History: Negative for: Anemia; Hemophilia; Human Immunodeficiency Virus; Lymphedema; Sickle Cell Disease Immunological Medical History: Negative for: Lupus Erythematosus; Raynauds;  Scleroderma Oncologic Medical History: Negative for: Received Chemotherapy; Received Radiation Immunizations Pneumococcal Vaccine: Received Pneumococcal Vaccination: No Implantable Devices None Family and Social History Cancer: Yes - Mother; Diabetes: Yes - Mother; Heart Disease: No; Hereditary Spherocytosis: No; Hypertension: Yes - Mother; Kidney Disease: No; Lung Disease: No; Seizures: No; Stroke: No; Thyroid Problems: Yes - Mother; Tuberculosis: No; Never smoker; Marital Status - Married; Alcohol Use: Moderate; Drug Use: No History; Caffeine Use: Daily; Financial Concerns: No; Food, Clothing or Shelter Needs: No; Support System Lacking: No; Transportation Concerns: No Electronic Signature(s) Signed: 05/21/2020 5:51:27 PM By: Yevonne Pax RN Signed: 05/22/2020 3:11:24 PM By: Baltazar Najjar MD Entered By: Yevonne Pax on 05/21/2020 11:13:50 -------------------------------------------------------------------------------- SuperBill Details Patient Name: Date of Service: Noah Cookey D. 05/21/2020 Medical Record Number: [id number] Patient Account Number: [id number] Date of Birth/Sex: Treating RN: August 29, 1955 (64 y.o. [name] Primary Care Provider: SYSTEM, PRO V IDER Other Clinician: Referring Provider: Treating Provider/Extender: Valentino Saxon in Treatment: 0 Diagnosis Coding ICD-10 Codes Code Description S80.812D Abrasion, left lower leg, subsequent encounter L97.822 Non-pressure chronic ulcer of other part of left lower leg with fat layer exposed E11.622 Type 2 diabetes mellitus with other skin ulcer Facility Procedures CPT4 Code: [id number] Description: WOUND CARE VISIT-LEV 3 NEW PT Modifier: 25 Quantity: 1 CPT4 Code: [id number] Description: 11042 - DEB SUBQ TISSUE 20 SQ CM/< ICD-10 Diagnosis Description L97.822 Non-pressure chronic ulcer of other part of left lower leg with fat layer expo Modifier: sed Quantity: 1 Physician Procedures : CPT4 Code  Description Modifier [id number] - WC PHYS LEVEL 3 - EST PT 25 ICD-10 Diagnosis Description S80.812D Abrasion, left lower leg, subsequent encounter L97.822 Non-pressure chronic ulcer of other part of left lower leg with fat layer exposed  E11.622 Type 2 diabetes mellitus with other skin ulcer Quantity: 1 : [id number] 11042 - WC  PHYS SUBQ TISS 20 SQ CM ICD-10 Diagnosis Description L97.822 Non-pressure chronic ulcer of other part of left lower leg with fat layer exposed Quantity: 1 Electronic Signature(s) Signed: 05/21/2020 6:16:40 PM By: Zandra Abts RN, BSN Signed: 05/22/2020 3:11:24 PM By: Baltazar Najjar MD Entered By: Zandra Abts on 05/21/2020 13:39:54

## 2020-05-28 ENCOUNTER — Encounter (HOSPITAL_BASED_OUTPATIENT_CLINIC_OR_DEPARTMENT_OTHER): Payer: PRIVATE HEALTH INSURANCE | Admitting: Internal Medicine

## 2020-05-28 ENCOUNTER — Other Ambulatory Visit: Payer: Self-pay

## 2020-05-28 DIAGNOSIS — E11622 Type 2 diabetes mellitus with other skin ulcer: Secondary | ICD-10-CM | POA: Diagnosis not present

## 2020-05-29 NOTE — Progress Notes (Signed)
VONG, GARRINGER ([id number]) Visit Report for 05/28/2020 Arrival Information Details Patient Name: Date of Service: Noah Mclaughlin, Noah Mclaughlin 05/28/2020 9:00 A M Medical Record Number: [id number] Patient Account Number: [id number] Date of Birth/Sex: Treating RN: [birth year] (64 y.o. [name] Primary Care Sweet Jarvis: SYSTEM, PRO V IDER Other Clinician: Referring Pelagia Iacobucci: Treating Kimberleigh Mehan/Extender: Valentino Saxon in Treatment: 1 Visit Information History Since Last Visit Added or deleted any medications: No Patient Arrived: Ambulatory Any new allergies or adverse reactions: No Arrival Time: 08:59 Had a fall or experienced change in No Accompanied By: wife activities of daily living that may affect Transfer Assistance: None risk of falls: Patient Identification Verified: Yes Signs or symptoms of abuse/neglect since last visito No Secondary Verification Process Completed: Yes Hospitalized since last visit: No Patient Requires Transmission-Based Precautions: No Implantable device outside of the clinic excluding No Patient Has Alerts: No cellular tissue based products placed in the center since last visit: Has Dressing in Place as Prescribed: Yes Pain Present Now: No Electronic Signature(s) Signed: 05/28/2020 1:12:05 PM By: Karl Ito Entered By: Karl Ito on 05/28/2020 09:00:57 -------------------------------------------------------------------------------- Encounter Discharge Information Details Patient Name: Date of Service: Noah Cookey D. 05/28/2020 9:00 A M Medical Record Number: [id number] Patient Account Number: [id number] Date of Birth/Sex: Treating RN: Aug 05, 1955 (64 y.o. Tammy Sours Primary Care Hero Mccathern: SYSTEM, PRO V IDER Other Clinician: Referring Frazer [place]: Treating Simcha Farrington/Extender: Valentino Saxon in Treatment: 1 Encounter Discharge Information Items Post Procedure Vitals Discharge Condition: Stable Temperature (F):  98.2 Ambulatory Status: Ambulatory Pulse (bpm): 65 Discharge Destination: Home Respiratory Rate (breaths/min): 18 Transportation: Private Auto Blood Pressure (mmHg): 118/78 Accompanied By: wife Schedule Follow-up Appointment: Yes Clinical Summary of Care: Electronic Signature(s) Signed: 05/28/2020 6:05:18 PM By: Shawn Stall Entered By: Shawn Stall on 05/28/2020 09:26:02 -------------------------------------------------------------------------------- Lower Extremity Assessment Details Patient Name: Date of Service: Noah Mclaughlin, Noah Mclaughlin 05/28/2020 9:00 A M Medical Record Number: [id number] Patient Account Number: [id number] Date of Birth/Sex: Treating RN: 06-17-1956 (64 y.o. Tammy Sours Primary Care Winola Drum: SYSTEM, PRO V IDER Other Clinician: Referring Deklan Minar: Treating Lahoma Constantin/Extender: Valentino Saxon in Treatment: 1 Edema Assessment Assessed: [Left: Yes] [Right: No] Edema: [Left: N] [Right: o] Calf Left: Right: Point of Measurement: 41 cm From Medial Instep 36 cm Ankle Left: Right: Point of Measurement: 10 cm From Medial Instep 21.5 cm Vascular Assessment Pulses: Dorsalis Pedis Palpable: [Left:Yes] Electronic Signature(s) Signed: 05/28/2020 6:05:18 PM By: Shawn Stall Entered By: Shawn Stall on 05/28/2020 09:08:13 -------------------------------------------------------------------------------- Multi Wound Chart Details Patient Name: Date of Service: Noah Cookey D. 05/28/2020 9:00 A M Medical Record Number: [id number] Patient Account Number: [id number] Date of Birth/Sex: Treating RN: 11-Feb-1956 (64 y.o. [name] Primary Care Sherena Machorro: SYSTEM, PRO V IDER Other Clinician: Referring Koran Seabrook: Treating Maritta Kief/Extender: Valentino Saxon in Treatment: 1 Vital Signs Height(in): 71 Pulse(bpm): 65 Weight(lbs): 230 Blood Pressure(mmHg): 118/78 Body Mass Index(BMI): 32 Temperature(F): 98.2 Respiratory Rate(breaths/min):  18 Photos: [1:No Photos Left, Anterior Lower Leg] [N/A:N/A N/A] Wound Location: [1:Trauma] [N/A:N/A] Wounding Event: [1:Diabetic Wound/Ulcer of the Lower] [N/A:N/A] Primary Etiology: [1:Extremity Asthma, Type II Diabetes,] [N/A:N/A] Comorbid History: [1:Osteoarthritis 04/16/2020] [N/A:N/A] Date Acquired: [1:1] [N/A:N/A] Weeks of Treatment: [1:Open] [N/A:N/A] Wound Status: [1:2x0.5x0.1] [N/A:N/A] Measurements L x W x D (cm) [1:0.785] [N/A:N/A] A (cm) : rea [1:0.079] [N/A:N/A] Volume (cm) : [1:66.70%] [N/A:N/A] % Reduction in A rea: [1:66.50%] [N/A:N/A] % Reduction in Volume: [1:Grade 2] [N/A:N/A] Classification: [1:Medium] [N/A:N/A] Exudate A mount: [1:Serosanguineous] [N/A:N/A] Exudate Type: [1:red, brown] [N/A:N/A] Exudate Color: [1:Distinct, outline attached] [N/A:N/A]  Wound Margin: [1:Large (67-100%)] [N/A:N/A] Granulation A mount: [1:Red] [N/A:N/A] Granulation Quality: [1:Small (1-33%)] [N/A:N/A] Necrotic A mount: [1:Fat Layer (Subcutaneous Tissue): Yes N/A] Exposed Structures: [1:Fascia: No Tendon: No Muscle: No Joint: No Bone: No Small (1-33%)] [N/A:N/A] Epithelialization: [1:Debridement - Selective/Open Wound N/A] Debridement: Pre-procedure Verification/Time Out 09:16 [N/A:N/A] Taken: [1:Other] [N/A:N/A] Pain Control: [1:Necrotic/Eschar] [N/A:N/A] Tissue Debrided: [1:Skin/Epidermis] [N/A:N/A] Level: [1:1] [N/A:N/A] Debridement A (sq cm): [1:rea Curette] [N/A:N/A] Instrument: [1:Minimum] [N/A:N/A] Bleeding: [1:Pressure] [N/A:N/A] Hemostasis A chieved: [1:0] [N/A:N/A] Procedural Pain: [1:0] [N/A:N/A] Post Procedural Pain: [1:Procedure was tolerated well] [N/A:N/A] Debridement Treatment Response: [1:2x0.5x0.1] [N/A:N/A] Post Debridement Measurements L x W x D (cm) [1:0.079] [N/A:N/A] Post Debridement Volume: (cm) [1:Debridement] [N/A:N/A] Treatment Notes Electronic Signature(s) Signed: 05/28/2020 5:39:18 PM By: Baltazar Najjar MD Signed: 05/28/2020 5:51:53 PM By:  Zandra Abts RN, BSN Entered By: Baltazar Najjar on 05/28/2020 09:20:31 -------------------------------------------------------------------------------- Multi-Disciplinary Care Plan Details Patient Name: Date of Service: Noah Cookey D. 05/28/2020 9:00 A M Medical Record Number: [id number] Patient Account Number: [id number] Date of Birth/Sex: Treating RN: 1956-02-09 (64 y.o. [name] Primary Care Marcelle Hepner: SYSTEM, PRO V IDER Other Clinician: Referring Elianna Windom: Treating Tameria Patti/Extender: Valentino Saxon in Treatment: 1 Active Inactive Nutrition Nursing Diagnoses: Impaired glucose control: actual or potential Potential for alteratiion in Nutrition/Potential for imbalanced nutrition Goals: Patient/caregiver agrees to and verbalizes understanding of need to use nutritional supplements and/or vitamins as prescribed Date Initiated: 05/21/2020 Target Resolution Date: 06/22/2020 Goal Status: Active Patient/caregiver will maintain therapeutic glucose control Date Initiated: 05/21/2020 Target Resolution Date: 06/22/2020 Goal Status: Active Interventions: Assess HgA1c results as ordered upon admission and as needed Assess patient nutrition upon admission and as needed per policy Provide education on elevated blood sugars and impact on wound healing Provide education on nutrition Treatment Activities: Education provided on Nutrition : 05/21/2020 Notes: Wound/Skin Impairment Nursing Diagnoses: Impaired tissue integrity Knowledge deficit related to ulceration/compromised skin integrity Goals: Patient/caregiver will verbalize understanding of skin care regimen Date Initiated: 05/21/2020 Target Resolution Date: 06/22/2020 Goal Status: Active Ulcer/skin breakdown will have a volume reduction of 30% by week 4 Date Initiated: 05/21/2020 Target Resolution Date: 06/22/2020 Goal Status: Active Interventions: Assess patient/caregiver ability to obtain necessary  supplies Assess patient/caregiver ability to perform ulcer/skin care regimen upon admission and as needed Assess ulceration(s) every visit Provide education on ulcer and skin care Notes: Electronic Signature(s) Signed: 05/28/2020 5:51:53 PM By: Zandra Abts RN, BSN Entered By: Zandra Abts on 05/28/2020 09:10:02 -------------------------------------------------------------------------------- Pain Assessment Details Patient Name: Date of Service: Noah Cookey D. 05/28/2020 9:00 A M Medical Record Number: [id number] Patient Account Number: [id number] Date of Birth/Sex: Treating RN: 10/04/1955 (64 y.o. [name] Primary Care Danaly Bari: SYSTEM, PRO V IDER Other Clinician: Referring Syvanna Ciolino: Treating Joel Mericle/Extender: Valentino Saxon in Treatment: 1 Active Problems Location of Pain Severity and Description of Pain Patient Has Paino No Site Locations Pain Management and Medication Current Pain Management: Electronic Signature(s) Signed: 05/28/2020 1:12:05 PM By: Karl Ito Signed: 05/28/2020 5:51:53 PM By: Zandra Abts RN, BSN Entered By: Karl Ito on 05/28/2020 09:01:16 -------------------------------------------------------------------------------- Patient/Caregiver Education Details Patient Name: Date of Service: Noah Mclaughlin 11/15/2021andnbsp9:00 A M Medical Record Number: [id number] Patient Account Number: [id number] Date of Birth/Gender: Treating RN: [birth year] (64 y.o. [name] Primary Care Physician: SYSTEM, PRO V IDER Other Clinician: Referring Physician: Treating Physician/Extender: Valentino Saxon in Treatment: 1 Education Assessment Education Provided To: Patient Education Topics Provided Nutrition: Methods: Explain/Verbal Responses: State content correctly Wound/Skin Impairment: Methods: Explain/Verbal Responses: State content correctly Electronic Signature(s) Signed:  05/28/2020 5:51:53 PM By: Zandra Abts RN, BSN Entered By: Zandra Abts on 05/28/2020 09:10:23 -------------------------------------------------------------------------------- Wound Assessment Details Patient Name: Date of Service: Noah Cookey D. 05/28/2020 9:00 A M Medical Record Number: [id number] Patient Account Number: [id number] Date of Birth/Sex: Treating RN: 05/30/1956 (64 y.o. [name] Primary Care Asiah Browder: SYSTEM, PRO V IDER Other Clinician: Referring Maxemiliano Riel: Treating Krew Hortman/Extender: Valentino Saxon in Treatment: 1 Wound Status Wound Number: 1 Primary Etiology: Diabetic Wound/Ulcer of the Lower Extremity Wound Location: Left, Anterior Lower Leg Wound Status: Open Wounding Event: Trauma Comorbid History: Asthma, Type II Diabetes, Osteoarthritis Date Acquired: 04/16/2020 Weeks Of Treatment: 1 Clustered Wound: No Wound Measurements Length: (cm) 2 Width: (cm) 0.5 Depth: (cm) 0.1 Area: (cm) 0.785 Volume: (cm) 0.079 % Reduction in Area: 66.7% % Reduction in Volume: 66.5% Epithelialization: Small (1-33%) Tunneling: No Undermining: No Wound Description Classification: Grade 2 Wound Margin: Distinct, outline attached Exudate Amount: Medium Exudate Type: Serosanguineous Exudate Color: red, brown Foul Odor After Cleansing: No Slough/Fibrino Yes Wound Bed Granulation Amount: Large (67-100%) Exposed Structure Granulation Quality: Red Fascia Exposed: No Necrotic Amount: Small (1-33%) Fat Layer (Subcutaneous Tissue) Exposed: Yes Necrotic Quality: Adherent Slough Tendon Exposed: No Muscle Exposed: No Joint Exposed: No Bone Exposed: No Treatment Notes Wound #1 (Left, Anterior Lower Leg) 1. Cleanse With Wound Cleanser 2. Periwound Care Skin Prep 3. Primary Dressing Applied Hydrofera Blue 4. Secondary Dressing Foam Border Dressing 5. Secured With Advice worker) Signed: 05/28/2020 5:51:53 PM By: Zandra Abts RN, BSN Signed:  05/28/2020 6:05:18 PM By: Shawn Stall Entered By: Shawn Stall on 05/28/2020 09:08:43 -------------------------------------------------------------------------------- Vitals Details Patient Name: Date of Service: Noah Cookey D. 05/28/2020 9:00 A M Medical Record Number: [id number] Patient Account Number: [id number] Date of Birth/Sex: Treating RN: 1956/02/24 (64 y.o. [name] Primary Care Nevaeh Korte: SYSTEM, PRO V IDER Other Clinician: Referring Alvita Fana: Treating Zacheriah Stumpe/Extender: Valentino Saxon in Treatment: 1 Vital Signs Time Taken: 09:00 Temperature (F): 98.2 Height (in): 71 Pulse (bpm): 65 Weight (lbs): 230 Respiratory Rate (breaths/min): 18 Body Mass Index (BMI): 32.1 Blood Pressure (mmHg): 118/78 Reference Range: 80 - 120 mg / dl Electronic Signature(s) Signed: 05/28/2020 1:12:05 PM By: Karl Ito Entered By: Karl Ito on 05/28/2020 09:01:11

## 2020-05-29 NOTE — Progress Notes (Signed)
LORETTA, KLUENDER ([id number]) Visit Report for 05/28/2020 Debridement Details Patient Name: Date of Service: Noah Mclaughlin, Noah Mclaughlin 05/28/2020 9:00 A M Medical Record Number: [id number] Patient Account Number: [id number] Date of Birth/Sex: Treating RN: Jan 11, 1956 (64 y.o. [name] Primary Care Provider: SYSTEM, PRO V IDER Other Clinician: Referring Provider: Treating Provider/Extender: Valentino Saxon in Treatment: 1 Debridement Performed for Assessment: Wound #1 Left,Anterior Lower Leg Performed By: Physician Maxwell Caul., MD Debridement Type: Debridement Severity of Tissue Pre Debridement: Fat layer exposed Level of Consciousness (Pre-procedure): Awake and Alert Pre-procedure Verification/Time Out Yes - 09:16 Taken: Start Time: 09:16 Pain Control: Other : Benzocaine 20% T Area Debrided (L x W): otal 2 (cm) x 0.5 (cm) = 1 (cm) Tissue and other material debrided: Viable, Non-Viable, Eschar, Skin: Epidermis Level: Skin/Epidermis Debridement Description: Selective/Open Wound Instrument: Curette Bleeding: Minimum Hemostasis Achieved: Pressure End Time: 09:17 Procedural Pain: 0 Post Procedural Pain: 0 Response to Treatment: Procedure was tolerated well Level of Consciousness (Post- Awake and Alert procedure): Post Debridement Measurements of Total Wound Length: (cm) 2 Width: (cm) 0.5 Depth: (cm) 0.1 Volume: (cm) 0.079 Character of Wound/Ulcer Post Debridement: Improved Severity of Tissue Post Debridement: Fat layer exposed Post Procedure Diagnosis Same as Pre-procedure Electronic Signature(s) Signed: 05/28/2020 5:39:18 PM By: Baltazar Najjar MD Signed: 05/28/2020 5:51:53 PM By: Zandra Abts RN, BSN Entered By: Baltazar Najjar on 05/28/2020 09:20:39 -------------------------------------------------------------------------------- HPI Details Patient Name: Date of Service: Noah Cookey D. 05/28/2020 9:00 A M Medical Record Number: [id number] Patient  Account Number: [id number] Date of Birth/Sex: Treating RN: May 09, 1956 (64 y.o. [name] Primary Care Provider: SYSTEM, PRO V IDER Other Clinician: Referring Provider: Treating Provider/Extender: Valentino Saxon in Treatment: 1 History of Present Illness HPI Description: ADMISSION 05/21/2020 This is a 64 year old man who is self-referred. He lives in the [place] area. October 4 he fell while getting out of the shower managed to traumatize his left anterior upper leg. He apparently went to an urgent care on October 10 and x-ray of the area was negative it sounds as though he got Rocephin and then an additional antibiotic for 10 days that were not sure of. He is a type II diabetic but does not have much else in terms of past medical history. Not have a wound history. He arrives in clinic today with the wound significantly hyper granulated. They have been using Neosporin and a dry dressing. Past medical history includes C2 fusion, C5-C6 decompression and fusion, asthma, type 2 diabetes and hypertension ABI in our clinic was 1.0 11/15; much better looking wound surface. Hydrofera Blue under compression. Surface area is Recruitment consultant) Signed: 05/28/2020 5:39:18 PM By: Baltazar Najjar MD Entered By: Baltazar Najjar on 05/28/2020 09:21:04 -------------------------------------------------------------------------------- Physical Exam Details Patient Name: Date of Service: Noah Cookey D. 05/28/2020 9:00 A M Medical Record Number: [id number] Patient Account Number: [id number] Date of Birth/Sex: Treating RN: 12-12-1955 (64 y.o. [name] Primary Care Provider: SYSTEM, PRO V IDER Other Clinician: Referring Provider: Treating Provider/Extender: Valentino Saxon in Treatment: 1 Constitutional Sitting or standing Blood Pressure is within target range for patient.. Pulse regular and within target range for patient.Marland Kitchen Respirations regular, non-labored  and within target range.. Temperature is normal and within the target range for the patient.Marland Kitchen Appears in no distress. Cardiovascular No pulses are palpable. Notes Wound exam; the areas on the left anterior mid to upper tibia area. The hyper granulation that was present last week I removed. The wound surface looks exceedingly healthy. Dry  skin and eschar around the circumference of the wound I removed with a #3 curette. This is hopefully to free up the wound edge. Edema control is adequate Electronic Signature(s) Signed: 05/28/2020 5:39:18 PM By: Baltazar Najjarobson, Brittney Caraway MD Entered By: Baltazar Najjarobson, Motty Borin on 05/28/2020 09:22:10 -------------------------------------------------------------------------------- Physician Orders Details Patient Name: Date of Service: Noah CookeyMURPHY, [name] 05/28/2020 9:00 A M Medical Record Number: [id number] Patient Account Number: [id number] Date of Birth/Sex: Treating RN: August 04, 1955 [age] y.o. [name]) Lynch, Shatara Primary Care Provider: SYSTEM, PRO V IDER Other Clinician: Referring Provider: Treating Provider/Extender: Valentino Saxonobson, Paisly Fingerhut Weeks in Treatment: 1 Verbal / Phone Orders: No Diagnosis Coding ICD-10 Coding Code Description S80.812D Abrasion, left lower leg, subsequent encounter L97.822 Non-pressure chronic ulcer of other part of left lower leg with fat layer exposed E11.622 Type 2 diabetes mellitus with other skin ulcer Follow-up Appointments Return Appointment in 2 weeks. Dressing Change Frequency Wound #1 Left,Anterior Lower Leg Change Dressing every other day. Wound Cleansing May shower and wash wound with soap and water. - on days that dressing is changed Primary Wound Dressing Wound #1 Left,Anterior Lower Leg Hydrofera Blue - Ready Secondary Dressing Wound #1 Left,Anterior Lower Leg Foam Border - or large bandaid Edema Control Avoid standing for long periods of time Elevate legs to the level of the heart or above for 30 minutes daily and/or when sitting,  a frequency of: - throughout the day Electronic Signature(s) Signed: 05/28/2020 5:39:18 PM By: Baltazar Najjarobson, Olyvia Gopal MD Signed: 05/28/2020 5:51:53 PM By: Zandra AbtsLynch, Shatara RN, BSN Entered By: Zandra AbtsLynch, Shatara on 05/28/2020 09:16:50 -------------------------------------------------------------------------------- Problem List Details Patient Name: Date of Service: Noah CookeyMURPHY, [name] 05/28/2020 9:00 A M Medical Record Number: [id number] Patient Account Number: [id number] Date of Birth/Sex: Treating RN: August 04, 1955 [age] y.o. [name]) Lynch, Shatara Primary Care Provider: SYSTEM, PRO V IDER Other Clinician: Referring Provider: Treating Provider/Extender: Valentino Saxonobson, Inell Mimbs Weeks in Treatment: 1 Active Problems ICD-10 Encounter Code Description Active Date MDM Diagnosis S80.812D Abrasion, left lower leg, subsequent encounter 05/21/2020 No Yes L97.822 Non-pressure chronic ulcer of other part of left lower leg with fat layer exposed11/02/2020 No Yes E11.622 Type 2 diabetes mellitus with other skin ulcer 05/21/2020 No Yes Inactive Problems Resolved Problems Electronic Signature(s) Signed: 05/28/2020 5:39:18 PM By: Baltazar Najjarobson, Reshonda Koerber MD Entered By: Baltazar Najjarobson, Alainah Phang on 05/28/2020 09:20:25 -------------------------------------------------------------------------------- Progress Note Details Patient Name: Date of Service: Noah CookeyMURPHY, [name] 05/28/2020 9:00 A M Medical Record Number: [id number] Patient Account Number: [id number] Date of Birth/Sex: Treating RN: August 04, 1955 [age] y.o. [name]) Lynch, Shatara Primary Care Provider: SYSTEM, PRO V IDER Other Clinician: Referring Provider: Treating Provider/Extender: Valentino Saxonobson, Mariesha Venturella Weeks in Treatment: 1 Subjective History of Present Illness (HPI) ADMISSION 05/21/2020 This is a 64 year old man who is self-referred. He lives in the [place] area. October 4 he fell while getting out of the shower managed to traumatize his left anterior upper leg. He apparently went to an urgent care  on October 10 and x-ray of the area was negative it sounds as though he got Rocephin and then an additional antibiotic for 10 days that were not sure of. He is a type II diabetic but does not have much else in terms of past medical history. Not have a wound history. He arrives in clinic today with the wound significantly hyper granulated. They have been using Neosporin and a dry dressing. Past medical history includes C2 fusion, C5-C6 decompression and fusion, asthma, type 2 diabetes and hypertension ABI in our clinic was 1.0 11/15; much better looking wound surface. Hydrofera Blue under compression. Surface area  is small Objective Constitutional Sitting or standing Blood Pressure is within target range for patient.. Pulse regular and within target range for patient.Marland Kitchen Respirations regular, non-labored and within target range.. Temperature is normal and within the target range for the patient.Marland Kitchen Appears in no distress. Vitals Time Taken: 9:00 AM, Height: 71 in, Weight: 230 lbs, BMI: 32.1, Temperature: 98.2 F, Pulse: 65 bpm, Respiratory Rate: 18 breaths/min, Blood Pressure: 118/78 mmHg. Cardiovascular No pulses are palpable. General Notes: Wound exam; the areas on the left anterior mid to upper tibia area. The hyper granulation that was present last week I removed. The wound surface looks exceedingly healthy. Dry skin and eschar around the circumference of the wound I removed with a #3 curette. This is hopefully to free up the wound edge. Edema control is adequate Integumentary (Hair, Skin) Wound #1 status is Open. Original cause of wound was Trauma. The wound is located on the Left,Anterior Lower Leg. The wound measures 2cm length x 0.5cm width x 0.1cm depth; 0.785cm^2 area and 0.079cm^3 volume. There is Fat Layer (Subcutaneous Tissue) exposed. There is no tunneling or undermining noted. There is a medium amount of serosanguineous drainage noted. The wound margin is distinct with the outline  attached to the wound base. There is large (67- 100%) red granulation within the wound bed. There is a small (1-33%) amount of necrotic tissue within the wound bed including Adherent Slough. Assessment Active Problems ICD-10 Abrasion, left lower leg, subsequent encounter Non-pressure chronic ulcer of other part of left lower leg with fat layer exposed Type 2 diabetes mellitus with other skin ulcer Procedures Wound #1 Pre-procedure diagnosis of Wound #1 is a Diabetic Wound/Ulcer of the Lower Extremity located on the Left,Anterior Lower Leg .Severity of Tissue Pre Debridement is: Fat layer exposed. There was a Selective/Open Wound Skin/Epidermis Debridement with a total area of 1 sq cm performed by Maxwell Caul., MD. With the following instrument(s): Curette to remove Viable and Non-Viable tissue/material. Material removed includes Eschar and Skin: Epidermis and after achieving pain control using Other (Benzocaine 20%). No specimens were taken. A time out was conducted at 09:16, prior to the start of the procedure. A Minimum amount of bleeding was controlled with Pressure. The procedure was tolerated well with a pain level of 0 throughout and a pain level of 0 following the procedure. Post Debridement Measurements: 2cm length x 0.5cm width x 0.1cm depth; 0.079cm^3 volume. Character of Wound/Ulcer Post Debridement is improved. Severity of Tissue Post Debridement is: Fat layer exposed. Post procedure Diagnosis Wound #1: Same as Pre-Procedure Plan Follow-up Appointments: Return Appointment in 2 weeks. Dressing Change Frequency: Wound #1 Left,Anterior Lower Leg: Change Dressing every other day. Wound Cleansing: May shower and wash wound with soap and water. - on days that dressing is changed Primary Wound Dressing: Wound #1 Left,Anterior Lower Leg: Hydrofera Blue - Ready Secondary Dressing: Wound #1 Left,Anterior Lower Leg: Foam Border - or large bandaid Edema Control: Avoid standing  for long periods of time Elevate legs to the level of the heart or above for 30 minutes daily and/or when sitting, a frequency of: - throughout the day 1. Wound looks quite healthy. At this point I do not think there is any need to change the dressing which is Hydrofera Blue 2. Surface area improved 3. Hyper granulation he presented with no longer evident. 4. Follow-up in 2 weeks. Electronic Signature(s) Signed: 05/28/2020 5:39:18 PM By: Baltazar Najjar MD Entered By: Baltazar Najjar on 05/28/2020 11:94:17 -------------------------------------------------------------------------------- SuperBill Details Patient Name: Date of  Service: Noah Mclaughlin, Noah Mclaughlin 05/28/2020 Medical Record Number: [id number] Patient Account Number: [id number] Date of Birth/Sex: Treating RN: Jun 06, 1956 (64 y.o. [name] Primary Care Provider: SYSTEM, PRO V IDER Other Clinician: Referring Provider: Treating Provider/Extender: Valentino Saxon in Treatment: 1 Diagnosis Coding ICD-10 Codes Code Description S80.812D Abrasion, left lower leg, subsequent encounter L97.822 Non-pressure chronic ulcer of other part of left lower leg with fat layer exposed E11.622 Type 2 diabetes mellitus with other skin ulcer Facility Procedures CPT4 Code: [id number] Description: [phone] - DEBRIDE WOUND 1ST 20 SQ CM OR < ICD-10 Diagnosis Description S80.812D Abrasion, left lower leg, subsequent encounter L97.822 Non-pressure chronic ulcer of other part of left lower leg with fat layer expose Modifier: d Quantity: 1 Physician Procedures : CPT4 Code Description Modifier [id number] - WC PHYS DEBR WO ANESTH 20 SQ CM ICD-10 Diagnosis Description S80.812D Abrasion, left lower leg, subsequent encounter L97.822 Non-pressure chronic ulcer of other part of left lower leg with fat layer  exposed Quantity: 1 Electronic Signature(s) Signed: 05/28/2020 5:39:18 PM By: Baltazar Najjar MD Entered By: Baltazar Najjar on 05/28/2020  09:23:42

## 2020-06-04 ENCOUNTER — Encounter (HOSPITAL_BASED_OUTPATIENT_CLINIC_OR_DEPARTMENT_OTHER): Payer: PRIVATE HEALTH INSURANCE | Admitting: Physician Assistant

## 2020-06-11 ENCOUNTER — Encounter (HOSPITAL_BASED_OUTPATIENT_CLINIC_OR_DEPARTMENT_OTHER): Payer: PRIVATE HEALTH INSURANCE | Admitting: Internal Medicine

## 2020-06-11 ENCOUNTER — Other Ambulatory Visit: Payer: Self-pay

## 2020-06-11 DIAGNOSIS — E11622 Type 2 diabetes mellitus with other skin ulcer: Secondary | ICD-10-CM | POA: Diagnosis not present

## 2020-06-11 NOTE — Progress Notes (Signed)
[name], [name] ([id number]) Visit Report for 06/11/2020 HPI Details Patient Name: Date of Service: Noah Mclaughlin, Noah Mclaughlin 06/11/2020 9:00 A M Medical Record Number: [id number] Patient Account Number: [id number] Date of Birth/Sex: Treating RN: Nov 11, 1955 (64 y.o. Noah Mclaughlin Primary Care Provider: SYSTEM, PRO V IDER Other Clinician: Referring Provider: Treating Provider/Extender: Valentino Saxon in Treatment: 3 History of Present Illness HPI Description: ADMISSION 05/21/2020 This is a 64 year old man who is self-referred. He lives in the [place] area. October 4 he fell while getting out of the shower managed to traumatize his left anterior upper leg. He apparently went to an urgent care on October 10 and x-ray of the area was negative it sounds as though he got Rocephin and then an additional antibiotic for 10 days that were not sure of. He is a type II diabetic but does not have much else in terms of past medical history. Not have a wound history. He arrives in clinic today with the wound significantly hyper granulated. They have been using Neosporin and a dry dressing. Past medical history includes C2 fusion, C5-C6 decompression and fusion, asthma, type 2 diabetes and hypertension ABI in our clinic was 1.0 11/15; much better looking wound surface. Hydrofera Blue under compression. Surface area is small 11/29; I thought this might be healed this week however it is still open but smaller. Electronic Signature(s) Signed: 06/11/2020 4:47:57 PM By: Baltazar Najjar MD Entered By: Baltazar Najjar on 06/11/2020 10:25:15 -------------------------------------------------------------------------------- Physical Exam Details Patient Name: Date of Service: Noah Cookey D. 06/11/2020 9:00 A M Medical Record Number: [id number] Patient Account Number: [id number] Date of Birth/Sex: Treating RN: 01/14/1956 (64 y.o. Noah Mclaughlin Primary Care Provider: SYSTEM, PRO V IDER Other  Clinician: Referring Provider: Treating Provider/Extender: Valentino Saxon in Treatment: 3 Constitutional Sitting or standing Blood Pressure is within target range for patient.. Pulse regular and within target range for patient.Marland Kitchen Respirations regular, non-labored and within target range.. Temperature is normal and within the target range for the patient.Marland Kitchen Appears in no distress. Notes Wound exam; the area on the left anterior mid to upper tibia. Surface of the wound looks healthy no debridement is required there is some light callus around the lateral part of the wound circumference. I had some thoughts about removing this although the wound is smaller surface looks healthier I did not change the primary dressing. Electronic Signature(s) Signed: 06/11/2020 4:47:57 PM By: Baltazar Najjar MD Entered By: Baltazar Najjar on 06/11/2020 10:26:25 -------------------------------------------------------------------------------- Physician Orders Details Patient Name: Date of Service: Noah Cookey D. 06/11/2020 9:00 A M Medical Record Number: [id number] Patient Account Number: [id number] Date of Birth/Sex: Treating RN: July 18, 1955 (64 y.o. Noah Mclaughlin Primary Care Provider: SYSTEM, PRO V IDER Other Clinician: Referring Provider: Treating Provider/Extender: Valentino Saxon in Treatment: 3 Verbal / Phone Orders: No Diagnosis Coding Follow-up Appointments Return Appointment in 2 weeks. Dressing Change Frequency Wound #1 Left,Anterior Lower Leg Change Dressing every other day. Wound Cleansing May shower and wash wound with soap and water. - on days that dressing is changed Primary Wound Dressing Wound #1 Left,Anterior Lower Leg Hydrofera Blue - Ready Secondary Dressing Wound #1 Left,Anterior Lower Leg Foam Border - or large bandaid Edema Control Avoid standing for long periods of time Elevate legs to the level of the heart or above for 30 minutes daily and/or when  sitting, a frequency of: - throughout the day Electronic Signature(s) Signed: 06/11/2020 4:47:57 PM By: Baltazar Najjar MD Signed: 06/11/2020 5:13:46 PM By: Fonnie Mu RN  Previous Signature: 06/11/2020 9:22:10 AM Version By: Fonnie Mu RN Entered By: Fonnie Mu on 06/11/2020 09:55:53 -------------------------------------------------------------------------------- Problem List Details Patient Name: Date of Service: Noah Cookey D. 06/11/2020 9:00 A M Medical Record Number: [id number] Patient Account Number: [id number] Date of Birth/Sex: Treating RN: 20-Sep-1955 (64 y.o. Noah Mclaughlin Primary Care Provider: SYSTEM, PRO V IDER Other Clinician: Referring Provider: Treating Provider/Extender: Valentino Saxon in Treatment: 3 Active Problems ICD-10 Encounter Code Description Active Date MDM Diagnosis S80.812D Abrasion, left lower leg, subsequent encounter 05/21/2020 No Yes L97.822 Non-pressure chronic ulcer of other part of left lower leg with fat layer exposed11/02/2020 No Yes E11.622 Type 2 diabetes mellitus with other skin ulcer 05/21/2020 No Yes Inactive Problems Resolved Problems Electronic Signature(s) Signed: 06/11/2020 4:47:57 PM By: Baltazar Najjar MD Entered By: Baltazar Najjar on 06/11/2020 10:24:47 -------------------------------------------------------------------------------- Progress Note Details Patient Name: Date of Service: Noah Cookey D. 06/11/2020 9:00 A M Medical Record Number: [id number] Patient Account Number: [id number] Date of Birth/Sex: Treating RN: Oct 22, 1955 (64 y.o. Noah Mclaughlin Primary Care Provider: SYSTEM, PRO V IDER Other Clinician: Referring Provider: Treating Provider/Extender: Valentino Saxon in Treatment: 3 Subjective History of Present Illness (HPI) ADMISSION 05/21/2020 This is a 64 year old man who is self-referred. He lives in the [place] area. October 4 he fell while getting out of the shower  managed to traumatize his left anterior upper leg. He apparently went to an urgent care on October 10 and x-ray of the area was negative it sounds as though he got Rocephin and then an additional antibiotic for 10 days that were not sure of. He is a type II diabetic but does not have much else in terms of past medical history. Not have a wound history. He arrives in clinic today with the wound significantly hyper granulated. They have been using Neosporin and a dry dressing. Past medical history includes C2 fusion, C5-C6 decompression and fusion, asthma, type 2 diabetes and hypertension ABI in our clinic was 1.0 11/15; much better looking wound surface. Hydrofera Blue under compression. Surface area is small 11/29; I thought this might be healed this week however it is still open but smaller. Objective Constitutional Sitting or standing Blood Pressure is within target range for patient.. Pulse regular and within target range for patient.Marland Kitchen Respirations regular, non-labored and within target range.. Temperature is normal and within the target range for the patient.Marland Kitchen Appears in no distress. Vitals Time Taken: 8:55 AM, Height: 71 in, Weight: 230 lbs, BMI: 32.1, Temperature: 98.1 F, Pulse: 64 bpm, Respiratory Rate: 18 breaths/min, Blood Pressure: 120/75 mmHg. General Notes: Wound exam; the area on the left anterior mid to upper tibia. Surface of the wound looks healthy no debridement is required there is some light callus around the lateral part of the wound circumference. I had some thoughts about removing this although the wound is smaller surface looks healthier I did not change the primary dressing. Integumentary (Hair, Skin) Wound #1 status is Open. Original cause of wound was Trauma. The wound is located on the Left,Anterior Lower Leg. The wound measures 1.6cm length x 0.3cm width x 0.1cm depth; 0.377cm^2 area and 0.038cm^3 volume. There is Fat Layer (Subcutaneous Tissue) exposed. There is no  tunneling or undermining noted. There is a small amount of serosanguineous drainage noted. The wound margin is distinct with the outline attached to the wound base. There is large (67- 100%) red granulation within the wound bed. There is no necrotic tissue within the wound bed. Assessment Active Problems ICD-10 Abrasion,  left lower leg, subsequent encounter Non-pressure chronic ulcer of other part of left lower leg with fat layer exposed Type 2 diabetes mellitus with other skin ulcer Plan Follow-up Appointments: Return Appointment in 2 weeks. Dressing Change Frequency: Wound #1 Left,Anterior Lower Leg: Change Dressing every other day. Wound Cleansing: May shower and wash wound with soap and water. - on days that dressing is changed Primary Wound Dressing: Wound #1 Left,Anterior Lower Leg: Hydrofera Blue - Ready Secondary Dressing: Wound #1 Left,Anterior Lower Leg: Foam Border - or large bandaid Edema Control: Avoid standing for long periods of time Elevate legs to the level of the heart or above for 30 minutes daily and/or when sitting, a frequency of: - throughout the day 1. I continued with the Hydrofera Blue/border foam 2. Follow-up 2 weeks Electronic Signature(s) Signed: 06/11/2020 4:47:57 PM By: Baltazar Najjar MD Entered By: Baltazar Najjar on 06/11/2020 10:27:11 -------------------------------------------------------------------------------- SuperBill Details Patient Name: Date of Service: Noah Cookey D. 06/11/2020 Medical Record Number: [id number] Patient Account Number: [id number] Date of Birth/Sex: Treating RN: 31-Jul-1955 (64 y.o. Noah Mclaughlin Primary Care Provider: SYSTEM, PRO V IDER Other Clinician: Referring Provider: Treating Provider/Extender: Valentino Saxon in Treatment: 3 Diagnosis Coding ICD-10 Codes Code Description S80.812D Abrasion, left lower leg, subsequent encounter L97.822 Non-pressure chronic ulcer of other part of left lower  leg with fat layer exposed E11.622 Type 2 diabetes mellitus with other skin ulcer Facility Procedures CPT4 Code: 88416606 Description: 99213 - WOUND CARE VISIT-LEV 3 EST PT Modifier: Quantity: 1 Physician Procedures : CPT4 Code Description Modifier [id number] 93235 - WC PHYS LEVEL 2 - EST PT ICD-10 Diagnosis Description S80.812D Abrasion, left lower leg, subsequent encounter L97.822 Non-pressure chronic ulcer of other part of left lower leg with fat layer exposed  E11.622 Type 2 diabetes mellitus with other skin ulcer Quantity: 1 Electronic Signature(s) Signed: 06/11/2020 4:47:57 PM By: Baltazar Najjar MD Entered By: Baltazar Najjar on 06/11/2020 10:27:25

## 2020-06-12 NOTE — Progress Notes (Signed)
Noah PartyMURPHY, Michaeljoseph D. ([id number]) Visit Report for 06/11/2020 Arrival Information Details Patient Name: Date of Service: Noah Mclaughlin, [name] 06/11/2020 9:00 A M Medical Record Number: [id number] Patient Account Number: [id number] Date of Birth/Sex: Treating RN: December 27, 1955 (64 y.o. [name]) Lynch, Shatara Primary Care Debe Anfinson: SYSTEM, PRO V IDER Other Clinician: Referring Reyli Schroth: Treating Mehr Depaoli/Extender: Valentino Saxonobson, Michael Weeks in Treatment: 3 Visit Information History Since Last Visit Added or deleted any medications: No Patient Arrived: Ambulatory Any new allergies or adverse reactions: No Arrival Time: 08:54 Had a fall or experienced change in No Accompanied By: wife activities of daily living that may affect Transfer Assistance: None risk of falls: Patient Identification Verified: Yes Signs or symptoms of abuse/neglect since last visito No Secondary Verification Process Completed: Yes Hospitalized since last visit: No Patient Requires Transmission-Based Precautions: No Implantable device outside of the clinic excluding No Patient Has Alerts: No cellular tissue based products placed in the center since last visit: Has Dressing in Place as Prescribed: Yes Pain Present Now: No Electronic Signature(s) Signed: 06/11/2020 9:54:43 AM By: Karl Itoawkins, Destiny Entered By: Karl Itoawkins, Destiny on 06/11/2020 08:54:45 -------------------------------------------------------------------------------- Clinic Level of Care Assessment Details Patient Name: Date of Service: Noah Mclaughlin, [name] 06/11/2020 9:00 A M Medical Record Number: [id number] Patient Account Number: [id number] Date of Birth/Sex: Treating RN: December 27, 1955 (64 y.o. [name]) Breedlove, Lauren Primary Care Anastazja Isaac: SYSTEM, PRO V IDER Other Clinician: Referring Arienna Benegas: Treating Shriley Joffe/Extender: Valentino Saxonobson, Michael Weeks in Treatment: 3 Clinic Level of Care Assessment Items TOOL 4 Quantity Score X- 1 0 Use when only an EandM is performed on  FOLLOW-UP visit ASSESSMENTS - Nursing Assessment / Reassessment X- 1 10 Reassessment of Co-morbidities (includes updates in patient status) X- 1 5 Reassessment of Adherence to Treatment Plan ASSESSMENTS - Wound and Skin A ssessment / Reassessment X - Simple Wound Assessment / Reassessment - one wound 1 5 []  - 0 Complex Wound Assessment / Reassessment - multiple wounds X- 1 10 Dermatologic / Skin Assessment (not related to wound area) ASSESSMENTS - Focused Assessment X- 1 5 Circumferential Edema Measurements - multi extremities X- 1 10 Nutritional Assessment / Counseling / Intervention X- 1 5 Lower Extremity Assessment (monofilament, tuning fork, pulses) []  - 0 Peripheral Arterial Disease Assessment (using hand held doppler) ASSESSMENTS - Ostomy and/or Continence Assessment and Care []  - 0 Incontinence Assessment and Management []  - 0 Ostomy Care Assessment and Management (repouching, etc.) PROCESS - Coordination of Care X - Simple Patient / Family Education for ongoing care 1 15 []  - 0 Complex (extensive) Patient / Family Education for ongoing care X- 1 10 Staff obtains ChiropractorConsents, Records, T Results / Process Orders est []  - 0 Staff telephones HHA, Nursing Homes / Clarify orders / etc []  - 0 Routine Transfer to another Facility (non-emergent condition) []  - 0 Routine Hospital Admission (non-emergent condition) []  - 0 New Admissions / Manufacturing engineernsurance Authorizations / Ordering NPWT Apligraf, etc. , []  - 0 Emergency Hospital Admission (emergent condition) []  - 0 Simple Discharge Coordination []  - 0 Complex (extensive) Discharge Coordination PROCESS - Special Needs []  - 0 Pediatric / Minor Patient Management []  - 0 Isolation Patient Management []  - 0 Hearing / Language / Visual special needs []  - 0 Assessment of Community assistance (transportation, D/C planning, etc.) []  - 0 Additional assistance / Altered mentation []  - 0 Support Surface(s) Assessment (bed, cushion,  seat, etc.) INTERVENTIONS - Wound Cleansing / Measurement X - Simple Wound Cleansing - one wound 1 5 []  - 0 Complex Wound Cleansing - multiple wounds  X- 1 5 Wound Imaging (photographs - any number of wounds) []  - 0 Wound Tracing (instead of photographs) X- 1 5 Simple Wound Measurement - one wound []  - 0 Complex Wound Measurement - multiple wounds INTERVENTIONS - Wound Dressings X - Small Wound Dressing one or multiple wounds 1 10 []  - 0 Medium Wound Dressing one or multiple wounds []  - 0 Large Wound Dressing one or multiple wounds []  - 0 Application of Medications - topical []  - 0 Application of Medications - injection INTERVENTIONS - Miscellaneous []  - 0 External ear exam []  - 0 Specimen Collection (cultures, biopsies, blood, body fluids, etc.) []  - 0 Specimen(s) / Culture(s) sent or taken to Lab for analysis []  - 0 Patient Transfer (multiple staff / / Similar devices) []  - 0 Simple Staple / Suture removal (25 or less) []  - 0 Complex Staple / Suture removal (26 or more) []  - 0 Hypo / Hyperglycemic Management (close monitor of Blood Glucose) []  - 0 Ankle / Brachial Index (ABI) - do not check if billed separately X- 1 5 Vital Signs Has the patient been seen at the hospital within the last three years: Yes Total Score: 105 Level Of Care: New/Established - Level 3 Electronic Signature(s) Signed: 06/11/2020 5:13:46 PM By: RN Entered By: on 06/11/2020 09:57:28 -------------------------------------------------------------------------------- Multi Wound Chart Details Patient Name: Date of Service: D. 06/11/2020 9:00 A M Medical Record Number: Patient Account Number: Date of Birth/Sex: Treating RN: October 01, 1955 (64 y.o. Primary Care Tony Friscia: SYSTEM, PRO V IDER Other Clinician: Referring Jasmon Mattice: Treating Ilyse Tremain/Extender: in Treatment: 3 Vital  Signs Height(in): 71 Pulse(bpm): 64 Weight(lbs): 230 Blood Pressure(mmHg): 120/75 Body Mass Index(BMI): 32 Temperature(F): 98.1 Respiratory Rate(breaths/min): 18 Photos: [1:No Photos Left, Anterior Lower Leg] [N/A:N/A N/A] Wound Location: [1:Trauma] [N/A:N/A] Wounding Event: [1:Diabetic Wound/Ulcer of the Lower] [N/A:N/A] Primary Etiology: [1:Extremity Asthma, Type II Diabetes,] [N/A:N/A] Comorbid History: [1:Osteoarthritis 04/16/2020] [N/A:N/A] Date Acquired: [1:3] [N/A:N/A] Weeks of Treatment: [1:Open] [N/A:N/A] Wound Status: [1:1.6x0.3x0.1] [N/A:N/A] Measurements L x W x D (cm) [1:0.377] [N/A:N/A] A (cm) : rea [1:0.038] [N/A:N/A] Volume (cm) : [1:84.00%] [N/A:N/A] % Reduction in A rea: [1:83.90%] [N/A:N/A] % Reduction in Volume: [1:Grade 2] [N/A:N/A] Classification: [1:Small] [N/A:N/A] Exudate A mount: [1:Serosanguineous] [N/A:N/A] Exudate Type: [1:red, brown] [N/A:N/A] Exudate Color: [1:Distinct, outline attached] [N/A:N/A] Wound Margin: [1:Large (67-100%)] [N/A:N/A] Granulation A mount: [1:Red] [N/A:N/A] Granulation Quality: [1:None Present (0%)] [N/A:N/A] Necrotic A mount: [1:Fat Layer (Subcutaneous Tissue): Yes N/A] Exposed Structures: [1:Fascia: No Tendon: No Muscle: No Joint: No Bone: No Small (1-33%)] [N/A:N/A] Treatment Notes Electronic Signature(s) Signed: 06/11/2020 4:47:57 PM By: 06/13/2020 MD Signed: 06/12/2020 6:12:47 PM By: Fonnie Mu RN, BSN Entered By: 06/13/2020 on 06/11/2020 10:24:54 -------------------------------------------------------------------------------- Multi-Disciplinary Care Plan Details Patient Name: Date of Service: 06/13/2020 D. 06/11/2020 9:00 A M Medical Record Number: [id number] Patient Account Number: 04/29/1956 Date of Birth/Sex: Treating RN: [birth year] (64 y.o. Valentino Saxon Primary Care Raelan Burgoon: SYSTEM, PRO V IDER Other Clinician: Referring Ximena Todaro: Treating Cohen Doleman/Extender: 06/16/2020 in Treatment: 3 Active Inactive Nutrition Nursing Diagnoses: Impaired glucose control: actual or potential Potential for alteratiion in Nutrition/Potential for imbalanced nutrition Goals: Patient/caregiver agrees to and verbalizes understanding of need to use nutritional supplements and/or vitamins as prescribed Date Initiated: 05/21/2020 Target Resolution Date: 06/22/2020 Goal Status: Active Patient/caregiver will maintain therapeutic glucose control Date Initiated: 05/21/2020 Target Resolution Date: 06/22/2020 Goal Status: Active Interventions: Assess HgA1c results as ordered upon  admission and as needed Assess patient nutrition upon admission and as needed per policy Provide education on elevated blood sugars and impact on wound healing Provide education on nutrition Treatment Activities: Education provided on Nutrition : 06/11/2020 Notes: Wound/Skin Impairment Nursing Diagnoses: Impaired tissue integrity Knowledge deficit related to ulceration/compromised skin integrity Goals: Patient/caregiver will verbalize understanding of skin care regimen Date Initiated: 05/21/2020 Target Resolution Date: 06/22/2020 Goal Status: Active Ulcer/skin breakdown will have a volume reduction of 30% by week 4 Date Initiated: 05/21/2020 Target Resolution Date: 06/22/2020 Goal Status: Active Interventions: Assess patient/caregiver ability to obtain necessary supplies Assess patient/caregiver ability to perform ulcer/skin care regimen upon admission and as needed Assess ulceration(s) every visit Provide education on ulcer and skin care Notes: Electronic Signature(s) Signed: 06/11/2020 5:13:46 PM By: Fonnie Mu RN Previous Signature: 06/11/2020 9:22:24 AM Version By: Fonnie Mu RN Entered By: Fonnie Mu on 06/11/2020 09:56:03 -------------------------------------------------------------------------------- Pain Assessment Details Patient Name: Date of  Service: Arbie Cookey D. 06/11/2020 9:00 A M Medical Record Number: [id number] Patient Account Number: [id number] Date of Birth/Sex: Treating RN: [birth year] (64 y.o. [name] Primary Care Brexlee Heberlein: SYSTEM, PRO V IDER Other Clinician: Referring Ky Rumple: Treating Theron Cumbie/Extender: Valentino Saxon in Treatment: 3 Active Problems Location of Pain Severity and Description of Pain Patient Has Paino No Site Locations Pain Management and Medication Current Pain Management: Electronic Signature(s) Signed: 06/11/2020 9:54:43 AM By: Karl Ito Signed: 06/12/2020 6:12:47 PM By: Zandra Abts RN, BSN Entered By: Karl Ito on 06/11/2020 08:56:48 -------------------------------------------------------------------------------- Patient/Caregiver Education Details Patient Name: Date of Service: Teeple, [name] 11/29/2021andnbsp9:00 A M Medical Record Number: [id number] Patient Account Number: [id number] Date of Birth/Gender: Treating RN: 1956-06-09 (64 y.o. Lucious Groves Primary Care Physician: SYSTEM, PRO V IDER Other Clinician: Referring Physician: Treating Physician/Extender: Valentino Saxon in Treatment: 3 Education Assessment Education Provided To: Patient Education Topics Provided Nutrition: Methods: Explain/Verbal Responses: Reinforcements needed Electronic Signature(s) Signed: 06/11/2020 5:13:46 PM By: Fonnie Mu RN Entered By: Fonnie Mu on 06/11/2020 09:56:16 -------------------------------------------------------------------------------- Wound Assessment Details Patient Name: Date of Service: Arbie Cookey D. 06/11/2020 9:00 A M Medical Record Number: [id number] Patient Account Number: [id number] Date of Birth/Sex: Treating RN: Aug 16, 1955 (64 y.o. [name] Primary Care Jaye Polidori: SYSTEM, PRO V IDER Other Clinician: Referring Zakyah Yanes: Treating Malissa Slay/Extender: Valentino Saxon in Treatment:  3 Wound Status Wound Number: 1 Primary Etiology: Diabetic Wound/Ulcer of the Lower Extremity Wound Location: Left, Anterior Lower Leg Wound Status: Open Wounding Event: Trauma Comorbid History: Asthma, Type II Diabetes, Osteoarthritis Date Acquired: 04/16/2020 Weeks Of Treatment: 3 Clustered Wound: No Photos Photo Uploaded By: Benjaman Kindler on 06/12/2020 14:38:48 Wound Measurements Length: (cm) 1.6 Width: (cm) 0.3 Depth: (cm) 0.1 Area: (cm) 0.377 Volume: (cm) 0.038 % Reduction in Area: 84% % Reduction in Volume: 83.9% Epithelialization: Small (1-33%) Tunneling: No Undermining: No Wound Description Classification: Grade 2 Wound Margin: Distinct, outline attached Exudate Amount: Small Exudate Type: Serosanguineous Exudate Color: red, brown Foul Odor After Cleansing: No Slough/Fibrino Yes Wound Bed Granulation Amount: Large (67-100%) Exposed Structure Granulation Quality: Red Fascia Exposed: No Necrotic Amount: None Present (0%) Fat Layer (Subcutaneous Tissue) Exposed: Yes Tendon Exposed: No Muscle Exposed: No Joint Exposed: No Bone Exposed: No Electronic Signature(s) Signed: 06/11/2020 4:43:25 PM By: Zenaida Deed RN, BSN Signed: 06/12/2020 6:12:47 PM By: Zandra Abts RN, BSN Entered By: Zenaida Deed on 06/11/2020 09:03:03 -------------------------------------------------------------------------------- Vitals Details Patient Name: Date of Service: Arbie Cookey D. 06/11/2020 9:00 A M Medical Record Number: [id number] Patient Account Number: [id number] Date  of Birth/Sex: Treating RN: 22-Jan-1956 (64 y.o. [name] Primary Care Dezra Mandella: SYSTEM, PRO V IDER Other Clinician: Referring Kalis Friese: Treating George Alcantar/Extender: Valentino Saxon in Treatment: 3 Vital Signs Time Taken: 08:55 Temperature (F): 98.1 Height (in): 71 Pulse (bpm): 64 Weight (lbs): 230 Respiratory Rate (breaths/min): 18 Body Mass Index (BMI): 32.1 Blood Pressure  (mmHg): 120/75 Reference Range: 80 - 120 mg / dl Electronic Signature(s) Signed: 06/11/2020 9:54:43 AM By: Karl Ito Entered By: Karl Ito on 06/11/2020 08:56:45

## 2020-06-25 ENCOUNTER — Other Ambulatory Visit: Payer: Self-pay

## 2020-06-25 ENCOUNTER — Encounter (HOSPITAL_BASED_OUTPATIENT_CLINIC_OR_DEPARTMENT_OTHER): Payer: PRIVATE HEALTH INSURANCE | Attending: Internal Medicine | Admitting: Internal Medicine

## 2020-06-25 DIAGNOSIS — E119 Type 2 diabetes mellitus without complications: Secondary | ICD-10-CM | POA: Insufficient documentation

## 2020-06-25 DIAGNOSIS — Z09 Encounter for follow-up examination after completed treatment for conditions other than malignant neoplasm: Secondary | ICD-10-CM | POA: Insufficient documentation

## 2020-06-25 DIAGNOSIS — Z872 Personal history of diseases of the skin and subcutaneous tissue: Secondary | ICD-10-CM | POA: Insufficient documentation

## 2020-06-25 DIAGNOSIS — I10 Essential (primary) hypertension: Secondary | ICD-10-CM | POA: Insufficient documentation

## 2020-06-28 NOTE — Progress Notes (Signed)
Noah Mclaughlin ([id number]) Visit Report for 06/25/2020 Arrival Information Details Patient Name: Date of Service: Noah Mclaughlin, Noah Mclaughlin 06/25/2020 9:15 A M Medical Record Number: [id number] Patient Account Number: [id number] Date of Birth/Sex: Treating RN: [birth year] (64 y.o. Noah Mclaughlin, Noah Mclaughlin Primary Care Noah Mclaughlin: SYSTEM, PRO V IDER Other Clinician: Referring Noah Mclaughlin: Treating Noah Mclaughlin/Extender: Noah Mclaughlin in Treatment: 5 Visit Information History Since Last Visit Added or deleted any medications: No Patient Arrived: Ambulatory Any new allergies or adverse reactions: No Arrival Time: 09:12 Had a fall or experienced change in No Accompanied By: self activities of daily living that may affect Transfer Assistance: None risk of falls: Patient Identification Verified: Yes Signs or symptoms of abuse/neglect since last visito No Secondary Verification Process Completed: Yes Hospitalized since last visit: No Patient Requires Transmission-Based Precautions: No Implantable device outside of the clinic excluding No Patient Has Alerts: No cellular tissue based products placed in the center since last visit: Has Dressing in Place as Prescribed: Yes Pain Present Now: No Electronic Signature(s) Signed: 06/27/2020 9:59:17 AM By: Noah Mu RN Entered By: Noah Mclaughlin on 06/25/2020 09:13:26 -------------------------------------------------------------------------------- Clinic Level of Care Assessment Details Patient Name: Date of Service: Noah Mclaughlin, Noah Mclaughlin 06/25/2020 9:15 A M Medical Record Number: [id number] Patient Account Number: [id number] Date of Birth/Sex: Treating RN: 1956/05/25 (64 y.o. Noah Mclaughlin Primary Care Kely Dohn: SYSTEM, PRO V IDER Other Clinician: Referring Noah Mclaughlin: Treating Noah Mclaughlin/Extender: Noah Mclaughlin in Treatment: 5 Clinic Level of Care Assessment Items TOOL 4 Quantity Score X- 1 0 Use when only an EandM is performed on  FOLLOW-UP visit ASSESSMENTS - Nursing Assessment / Reassessment X- 1 10 Reassessment of Co-morbidities (includes updates in patient status) X- 1 5 Reassessment of Adherence to Treatment Plan ASSESSMENTS - Wound and Skin A ssessment / Reassessment X - Simple Wound Assessment / Reassessment - one wound 1 5 []  - 0 Complex Wound Assessment / Reassessment - multiple wounds []  - 0 Dermatologic / Skin Assessment (not related to wound area) ASSESSMENTS - Focused Assessment []  - 0 Circumferential Edema Measurements - multi extremities []  - 0 Nutritional Assessment / Counseling / Intervention X- 1 5 Lower Extremity Assessment (monofilament, tuning fork, pulses) []  - 0 Peripheral Arterial Disease Assessment (using hand held doppler) ASSESSMENTS - Ostomy and/or Continence Assessment and Care []  - 0 Incontinence Assessment and Management []  - 0 Ostomy Care Assessment and Management (repouching, etc.) PROCESS - Coordination of Care X - Simple Patient / Family Education for ongoing care 1 15 []  - 0 Complex (extensive) Patient / Family Education for ongoing care X- 1 10 Staff obtains , Records, T Results / Process Orders est []  - 0 Staff telephones HHA, Nursing Homes / Clarify orders / etc []  - 0 Routine Transfer to another Facility (non-emergent condition) []  - 0 Routine Hospital Admission (non-emergent condition) []  - 0 New Admissions / / Ordering NPWT Apligraf, etc. , []  - 0 Emergency Hospital Admission (emergent condition) X- 1 10 Simple Discharge Coordination []  - 0 Complex (extensive) Discharge Coordination PROCESS - Special Needs []  - 0 Pediatric / Minor Patient Management []  - 0 Isolation Patient Management []  - 0 Hearing / Language / Visual special needs []  - 0 Assessment of Community assistance (transportation, D/C planning, etc.) []  - 0 Additional assistance / Altered mentation []  - 0 Support Surface(s) Assessment (bed, cushion,  seat, etc.) INTERVENTIONS - Wound Cleansing / Measurement X - Simple Wound Cleansing - one wound 1 5 []  - 0 Complex Wound Cleansing - multiple  wounds X- 1 5 Wound Imaging (photographs - any number of wounds) []  - 0 Wound Tracing (instead of photographs) X- 1 5 Simple Wound Measurement - one wound []  - 0 Complex Wound Measurement - multiple wounds INTERVENTIONS - Wound Dressings []  - 0 Small Wound Dressing one or multiple wounds []  - 0 Medium Wound Dressing one or multiple wounds []  - 0 Large Wound Dressing one or multiple wounds []  - 0 Application of Medications - topical []  - 0 Application of Medications - injection INTERVENTIONS - Miscellaneous []  - 0 External ear exam []  - 0 Specimen Collection (cultures, biopsies, blood, body fluids, etc.) []  - 0 Specimen(s) / Culture(s) sent or taken to Lab for analysis []  - 0 Patient Transfer (multiple staff / Nurse, adultHoyer Lift / Similar devices) []  - 0 Simple Staple / Suture removal (25 or less) []  - 0 Complex Staple / Suture removal (26 or more) []  - 0 Hypo / Hyperglycemic Management (close monitor of Blood Glucose) []  - 0 Ankle / Brachial Index (ABI) - do not check if billed separately X- 1 5 Vital Signs Has the patient been seen at the hospital within the last three years: Yes Total Score: 80 Level Of Care: New/Established - Level 3 Electronic Signature(s) Signed: 06/26/2020 5:24:10 PM By: Noah Mclaughlin, Shatara RN, BSN Entered By: Noah Mclaughlin, Noah Mclaughlin on 06/25/2020 09:53:54 -------------------------------------------------------------------------------- Encounter Discharge Information Details Patient Name: Date of Service: Noah Mclaughlin, [name] 06/25/2020 9:15 A M Medical Record Number: [id number] Patient Account Number: [id number] Date of Birth/Sex: Treating RN: September 25, 1955 [age] y.o. Noah KollerM) Lynch, Noah Mclaughlin Primary Care Noah Mclaughlin: SYSTEM, PRO V IDER Other Clinician: Referring Noah Mclaughlin Noah Mclaughlin: Treating Noah Mclaughlin/Extender: Noah Saxonobson, Noah Mclaughlin in  Treatment: 5 Encounter Discharge Information Items Discharge Condition: Stable Ambulatory Status: Ambulatory Discharge Destination: Home Transportation: Private Auto Accompanied By: wife Schedule Follow-up Appointment: Yes Clinical Summary of Care: Patient Declined Electronic Signature(s) Signed: 06/26/2020 5:24:10 PM By: Noah Mclaughlin, Shatara RN, BSN Entered By: Noah Mclaughlin, Noah Mclaughlin on 06/25/2020 09:54:12 -------------------------------------------------------------------------------- Lower Extremity Assessment Details Patient Name: Date of Service: Noah Mclaughlin, [name] 06/25/2020 9:15 A M Medical Record Number: [id number] Patient Account Number: [id number] Date of Birth/Sex: Treating RN: September 25, 1955 (64 y.o. Lucious GrovesM) Breedlove, Noah Mclaughlin Primary Care Kamariah Fruchter: SYSTEM, PRO V IDER Other Clinician: Referring Lurae Hornbrook: Treating Alonnie Bieker/Extender: Noah Saxonobson, Noah Mclaughlin in Treatment: 5 Edema Assessment Assessed: [Left: Yes] [Right: No] Edema: [Left: N] [Right: o] Calf Left: Right: Point of Measurement: 41 cm From Medial Instep 36 cm Ankle Left: Right: Point of Measurement: 10 cm From Medial Instep 21.5 cm Vascular Assessment Pulses: Dorsalis Pedis Palpable: [Left:Yes] Posterior Tibial Palpable: [Left:Yes] Electronic Signature(s) Signed: 06/27/2020 9:59:17 AM By: Noah MuBreedlove, Lauren RN Entered By: Noah MuBreedlove, Noah Mclaughlin on 06/25/2020 09:14:34 -------------------------------------------------------------------------------- Multi Wound Chart Details Patient Name: Date of Service: Noah Mclaughlin, [name] 06/25/2020 9:15 A M Medical Record Number: [id number] Patient Account Number: [id number] Date of Birth/Sex: Treating RN: September 25, 1955 [age] y.o. Noah KollerM) Lynch, Noah Mclaughlin Primary Care Kaelei Wheeler: SYSTEM, PRO V IDER Other Clinician: Referring Demetrick Eichenberger: Treating Yaritzy Huser/Extender: Noah Saxonobson, Noah Mclaughlin in Treatment: 5 Vital Signs Height(in): 71 Pulse(bpm): 64 Weight(lbs): 230 Blood Pressure(mmHg): 112/77 Body Mass  Index(BMI): 32 Temperature(F): 97.7 Respiratory Rate(breaths/min): 18 Photos: [1:No Photos Left, Anterior Lower Leg] [N/A:N/A N/A] Wound Location: [1:Trauma] [N/A:N/A] Wounding Event: [1:Diabetic Wound/Ulcer of the Lower] [N/A:N/A] Primary Etiology: [1:Extremity Asthma, Type II Diabetes,] [N/A:N/A] Comorbid History: [1:Osteoarthritis 04/16/2020] [N/A:N/A] Date Acquired: [1:5] [N/A:N/A] Mclaughlin of Treatment: [1:Healed - Epithelialized] [N/A:N/A] Wound Status: [1:0x0x0] [N/A:N/A] Measurements L x W x D (cm) [1:0] [N/A:N/A] A (cm) : rea [1:0] [N/A:N/A] Volume (cm) : [  1:100.00%] [N/A:N/A] % Reduction in A rea: [1:100.00%] [N/A:N/A] % Reduction in Volume: [1:Grade 2] [N/A:N/A] Classification: [1:None Present] [N/A:N/A] Exudate A mount: [1:Distinct, outline attached] [N/A:N/A] Wound Margin: [1:None Present (0%)] [N/A:N/A] Granulation A mount: [1:None Present (0%)] [N/A:N/A] Necrotic A mount: [1:Fascia: No] [N/A:N/A] Exposed Structures: [1:Fat Layer (Subcutaneous Tissue): No Tendon: No Muscle: No Joint: No Bone: No Large (67-100%)] [N/A:N/A] Treatment Notes Electronic Signature(s) Signed: 06/26/2020 5:24:10 PM By: Noah Abts RN, BSN Signed: 06/28/2020 8:09:40 AM By: Baltazar Najjar MD Entered By: Baltazar Najjar on 06/25/2020 10:37:51 -------------------------------------------------------------------------------- Multi-Disciplinary Care Plan Details Patient Name: Date of Service: Noah Cookey D. 06/25/2020 9:15 A M Medical Record Number: [id number] Patient Account Number: [id number] Date of Birth/Sex: Treating RN: June 03, 1956 (64 y.o. Noah Mclaughlin Primary Care Atina Feeley: SYSTEM, PRO V IDER Other Clinician: Referring Rhonna Holster: Treating John Williamsen/Extender: Noah Mclaughlin in Treatment: 5 Active Inactive Electronic Signature(s) Signed: 06/26/2020 5:24:10 PM By: Noah Abts RN, BSN Entered By: Noah Abts on 06/25/2020  09:50:24 -------------------------------------------------------------------------------- Pain Assessment Details Patient Name: Date of Service: Noah Cookey D. 06/25/2020 9:15 A M Medical Record Number: [id number] Patient Account Number: [id number] Date of Birth/Sex: Treating RN: [birth year] (64 y.o. Lucious Groves Primary Care Thelbert Gartin: SYSTEM, PRO V IDER Other Clinician: Referring Hardeep Reetz: Treating Mailen Newborn/Extender: Noah Mclaughlin in Treatment: 5 Active Problems Location of Pain Severity and Description of Pain Patient Has Paino No Site Locations Rate the pain. Current Pain Level: 0 Pain Management and Medication Current Pain Management: Electronic Signature(s) Signed: 06/27/2020 9:59:17 AM By: Noah Mu RN Entered By: Noah Mclaughlin on 06/25/2020 09:14:18 -------------------------------------------------------------------------------- Patient/Caregiver Education Details Patient Name: Date of Service: Noah Mclaughlin 12/13/2021andnbsp9:15 A M Medical Record Number: [id number] Patient Account Number: [id number] Date of Birth/Gender: Treating RN: 1955/12/11 (64 y.o. Noah Mclaughlin Primary Care Physician: SYSTEM, PRO V IDER Other Clinician: Referring Physician: Treating Physician/Extender: Noah Mclaughlin in Treatment: 5 Education Assessment Education Provided To: Patient Education Topics Provided Wound/Skin Impairment: Methods: Explain/Verbal Responses: State content correctly Electronic Signature(s) Signed: 06/26/2020 5:24:10 PM By: Noah Abts RN, BSN Entered By: Noah Abts on 06/25/2020 09:50:48 -------------------------------------------------------------------------------- Wound Assessment Details Patient Name: Date of Service: Noah Cookey D. 06/25/2020 9:15 A M Medical Record Number: [id number] Patient Account Number: [id number] Date of Birth/Sex: Treating RN: 1956-04-12 (64 y.o. Noah Mclaughlin,  Noah Mclaughlin Primary Care Cierria Height: SYSTEM, PRO V IDER Other Clinician: Referring Catelin Manthe: Treating Eural Holzschuh/Extender: Noah Mclaughlin in Treatment: 5 Wound Status Wound Number: 1 Primary Etiology: Diabetic Wound/Ulcer of the Lower Extremity Wound Location: Left, Anterior Lower Leg Wound Status: Healed - Epithelialized Wounding Event: Trauma Comorbid History: Asthma, Type II Diabetes, Osteoarthritis Date Acquired: 04/16/2020 Mclaughlin Of Treatment: 5 Clustered Wound: No Photos Photo Uploaded By: Benjaman Kindler on 06/27/2020 14:24:21 Wound Measurements Length: (cm) Width: (cm) Depth: (cm) Area: (cm) Volume: (cm) 0 % Reduction in Area: 100% 0 % Reduction in Volume: 100% 0 Epithelialization: Large (67-100%) 0 Tunneling: No 0 Undermining: No Wound Description Classification: Grade 2 Wound Margin: Distinct, outline attached Exudate Amount: None Present Foul Odor After Cleansing: No Slough/Fibrino No Wound Bed Granulation Amount: None Present (0%) Exposed Structure Necrotic Amount: None Present (0%) Fascia Exposed: No Fat Layer (Subcutaneous Tissue) Exposed: No Tendon Exposed: No Muscle Exposed: No Joint Exposed: No Bone Exposed: No Electronic Signature(s) Signed: 06/26/2020 5:24:10 PM By: Noah Abts RN, BSN Signed: 06/27/2020 9:59:17 AM By: Noah Mu RN Entered By: Noah Abts on 06/25/2020 09:54:50 -------------------------------------------------------------------------------- Vitals Details Patient Name: Date of Service: Noah Cookey D. 06/25/2020 9:15 A  M Medical Record Number: [id number] Patient Account Number: [id number] Date of Birth/Sex: Treating RN: 1955/11/12 (64 y.o. Noah Mclaughlin, Noah Mclaughlin Primary Care Allexis Bordenave: SYSTEM, PRO V IDER Other Clinician: Referring Caulin Begley: Treating Reveca Desmarais/Extender: Noah Mclaughlin in Treatment: 5 Vital Signs Time Taken: 09:13 Temperature (F): 97.7 Height (in): 71 Pulse (bpm): 64 Weight (lbs):  230 Respiratory Rate (breaths/min): 18 Body Mass Index (BMI): 32.1 Blood Pressure (mmHg): 112/77 Reference Range: 80 - 120 mg / dl Electronic Signature(s) Signed: 06/27/2020 9:59:17 AM By: Noah Mu RN Entered By: Noah Mclaughlin on 06/25/2020 09:13:46

## 2020-06-28 NOTE — Progress Notes (Addendum)
RIOT, WATERWORTH ([id number]) Visit Report for 06/25/2020 HPI Details Patient Name: Date of Service: Noah Mclaughlin, Noah Mclaughlin 06/25/2020 9:15 A M Medical Record Number: [id number] Patient Account Number: [id number] Date of Birth/Sex: Treating RN: 1956-04-30 (64 y.o. Noah Mclaughlin Primary Care Provider: SYSTEM, PRO V IDER Other Clinician: Referring Provider: Treating Provider/Extender: Valentino Saxon in Treatment: 5 History of Present Illness HPI Description: ADMISSION 05/21/2020 This is a 64 year old man who is self-referred. He lives in the [place] area. October 4 he fell while getting out of the shower managed to traumatize his left anterior upper leg. He apparently went to an urgent care on October 10 and x-ray of the area was negative it sounds as though he got Rocephin and then an additional antibiotic for 10 days that were not sure of. He is a type II diabetic but does not have much else in terms of past medical history. Not have a wound history. He arrives in clinic today with the wound significantly hyper granulated. They have been using Neosporin and a dry dressing. Past medical history includes C2 fusion, C5-C6 decompression and fusion, asthma, type 2 diabetes and hypertension ABI in our clinic was 1.0 11/15; much better looking wound surface. Hydrofera Blue under compression. Surface area is small 11/29; I thought this might be healed this week however it is still open but smaller. 12/13; the patient's wound is closed. Fully epithelialized. Scar tissue Electronic Signature(s) Signed: 06/28/2020 8:09:40 AM By: Baltazar Najjar MD Entered By: Baltazar Najjar on 06/25/2020 10:38:28 -------------------------------------------------------------------------------- Physical Exam Details Patient Name: Date of Service: Noah Cookey D. 06/25/2020 9:15 A M Medical Record Number: [id number] Patient Account Number: [id number] Date of Birth/Sex: Treating RN: 17-Apr-1956 (64 y.o. Noah Mclaughlin Primary Care Provider: SYSTEM, PRO V IDER Other Clinician: Referring Provider: Treating Provider/Extender: Valentino Saxon in Treatment: 5 Constitutional Sitting or standing Blood Pressure is within target range for patient.. Pulse regular and within target range for patient.Marland Kitchen Respirations regular, non-labored and within target range.. Temperature is normal and within the target range for the patient.Marland Kitchen Appears in no distress. Notes Wound exam; the area on the left anterior to upper tibia. This is completely closed. Scar tissue present. This does not look infected Electronic Signature(s) Signed: 06/28/2020 8:09:40 AM By: Baltazar Najjar MD Entered By: Baltazar Najjar on 06/25/2020 10:39:31 -------------------------------------------------------------------------------- Physician Orders Details Patient Name: Date of Service: Noah Cookey D. 06/25/2020 9:15 A M Medical Record Number: [id number] Patient Account Number: [id number] Date of Birth/Sex: Treating RN: October 17, 1955 (64 y.o. Noah Mclaughlin Primary Care Provider: SYSTEM, PRO V IDER Other Clinician: Referring Provider: Treating Provider/Extender: Valentino Saxon in Treatment: 5 Verbal / Phone Orders: No Diagnosis Coding ICD-10 Coding Code Description S80.812D Abrasion, left lower leg, subsequent encounter L97.822 Non-pressure chronic ulcer of other part of left lower leg with fat layer exposed E11.622 Type 2 diabetes mellitus with other skin ulcer Discharge From [facility] Services Discharge from Wound Care Center Wound Treatment Electronic Signature(s) Signed: 06/26/2020 5:24:10 PM By: Zandra Abts RN, BSN Signed: 06/28/2020 8:09:40 AM By: Baltazar Najjar MD Entered By: Zandra Abts on 06/25/2020 09:36:28 -------------------------------------------------------------------------------- Problem List Details Patient Name: Date of Service: Noah Cookey D. 06/25/2020 9:15 A M Medical Record  Number: [id number] Patient Account Number: [id number] Date of Birth/Sex: Treating RN: 12-Dec-1955 (64 y.o. Noah Mclaughlin Primary Care Provider: SYSTEM, PRO V IDER Other Clinician: Referring Provider: Treating Provider/Extender: Valentino Saxon in Treatment: 5 Active Problems ICD-10 Encounter Code Description Active Date MDM Diagnosis S80.812D  Abrasion, left lower leg, subsequent encounter 05/21/2020 No Yes L97.822 Non-pressure chronic ulcer of other part of left lower leg with fat layer exposed11/02/2020 No Yes E11.622 Type 2 diabetes mellitus with other skin ulcer 05/21/2020 No Yes Inactive Problems Resolved Problems Electronic Signature(s) Signed: 06/28/2020 8:09:40 AM By: Baltazar Najjar MD Entered By: Baltazar Najjar on 06/25/2020 10:37:43 -------------------------------------------------------------------------------- Progress Note Details Patient Name: Date of Service: Noah Cookey D. 06/25/2020 9:15 A M Medical Record Number: [id number] Patient Account Number: [id number] Date of Birth/Sex: Treating RN: 08-Oct-1955 (64 y.o. Noah Mclaughlin Primary Care Provider: SYSTEM, PRO V IDER Other Clinician: Referring Provider: Treating Provider/Extender: Valentino Saxon in Treatment: 5 Subjective History of Present Illness (HPI) ADMISSION 05/21/2020 This is a 64 year old man who is self-referred. He lives in the [place] area. October 4 he fell while getting out of the shower managed to traumatize his left anterior upper leg. He apparently went to an urgent care on October 10 and x-ray of the area was negative it sounds as though he got Rocephin and then an additional antibiotic for 10 days that were not sure of. He is a type II diabetic but does not have much else in terms of past medical history. Not have a wound history. He arrives in clinic today with the wound significantly hyper granulated. They have been using Neosporin and a dry dressing. Past medical history  includes C2 fusion, C5-C6 decompression and fusion, asthma, type 2 diabetes and hypertension ABI in our clinic was 1.0 11/15; much better looking wound surface. Hydrofera Blue under compression. Surface area is small 11/29; I thought this might be healed this week however it is still open but smaller. 12/13; the patient's wound is closed. Fully epithelialized. Scar tissue Objective Constitutional Sitting or standing Blood Pressure is within target range for patient.. Pulse regular and within target range for patient.Marland Kitchen Respirations regular, non-labored and within target range.. Temperature is normal and within the target range for the patient.Marland Kitchen Appears in no distress. Vitals Time Taken: 9:13 AM, Height: 71 in, Weight: 230 lbs, BMI: 32.1, Temperature: 97.7 F, Pulse: 64 bpm, Respiratory Rate: 18 breaths/min, Blood Pressure: 112/77 mmHg. General Notes: Wound exam; the area on the left anterior to upper tibia. This is completely closed. Scar tissue present. This does not look infected Integumentary (Hair, Skin) Wound #1 status is Healed - Epithelialized. Original cause of wound was Trauma. The wound is located on the Left,Anterior Lower Leg. The wound measures 0cm length x 0cm width x 0cm depth; 0cm^2 area and 0cm^3 volume. There is no tunneling or undermining noted. There is a none present amount of drainage noted. The wound margin is distinct with the outline attached to the wound base. There is no granulation within the wound bed. There is no necrotic tissue within the wound bed. Assessment Active Problems ICD-10 Abrasion, left lower leg, subsequent encounter Non-pressure chronic ulcer of other part of left lower leg with fat layer exposed Type 2 diabetes mellitus with other skin ulcer Plan [facility]charge From [facility] Services: Discharge from Wound Care Center 1. The patient can be discharged from the wound care center 2. This was initially trauma, I do not think there is any secondary prevention  that needs to be discussed other than have advised him to keep this area padded so there is no friction on his clothing for the next 2 to 3 weeks Electronic Signature(s) Signed: 06/28/2020 8:09:40 AM By: Baltazar Najjar MD Entered By: Baltazar Najjar on 06/25/2020 10:40:14 -------------------------------------------------------------------------------- SuperBill Details Patient Name: Date  of Service: Noah Mclaughlin, Noah Mclaughlin 06/25/2020 Medical Record Number: [id number] Patient Account Number: [id number] Date of Birth/Sex: Treating RN: 1955-09-25 (64 y.o. Noah Mclaughlin Primary Care Provider: SYSTEM, PRO V IDER Other Clinician: Referring Provider: Treating Provider/Extender: Valentino Saxon in Treatment: 5 Diagnosis Coding ICD-10 Codes Code Description S80.812D Abrasion, left lower leg, subsequent encounter L97.822 Non-pressure chronic ulcer of other part of left lower leg with fat layer exposed E11.622 Type 2 diabetes mellitus with other skin ulcer Facility Procedures CPT4 Code: [id number] Description: 99213 - WOUND CARE VISIT-LEV 3 EST PT Modifier: Quantity: 1 Physician Procedures : CPT4 Code Description Modifier [id number] - WC PHYS LEVEL 2 - EST PT ICD-10 Diagnosis Description S80.812D Abrasion, left lower leg, subsequent encounter L97.822 Non-pressure chronic ulcer of other part of left lower leg with fat layer exposed  E11.622 Type 2 diabetes mellitus with other skin ulcer Quantity: 1 Electronic Signature(s) Signed: 06/28/2020 8:09:40 AM By: Baltazar Najjar MD Entered By: Baltazar Najjar on 06/25/2020 10:40:29
